# Patient Record
Sex: Male | Born: 1985 | Race: Black or African American | Hispanic: No | Marital: Married | Smoking: Former smoker
Health system: Southern US, Community
[De-identification: ages and names within clinical notes are randomized; demographics above are authoritative.]

## PROBLEM LIST (undated history)

## (undated) DIAGNOSIS — D709 Neutropenia, unspecified: Secondary | ICD-10-CM

## (undated) HISTORY — DX: Neutropenia, unspecified: D70.9

## (undated) HISTORY — PX: FRACTURE SURGERY: SHX138

## (undated) HISTORY — PX: HERNIA REPAIR: SHX51

## (undated) HISTORY — PX: OTHER SURGICAL HISTORY: SHX169

---

## 2016-12-03 ENCOUNTER — Ambulatory Visit (HOSPITAL_COMMUNITY)
Admission: EM | Admit: 2016-12-03 | Discharge: 2016-12-03 | Disposition: A | Discharge status: Home / Self Care | Payer: Self-pay | Attending: Family Medicine | Admitting: Family Medicine

## 2016-12-03 ENCOUNTER — Encounter (HOSPITAL_COMMUNITY): Payer: Self-pay | Admitting: *Deleted

## 2016-12-03 ENCOUNTER — Emergency Department (HOSPITAL_COMMUNITY)
Admission: EM | Admit: 2016-12-03 | Discharge: 2016-12-04 | Disposition: A | Discharge status: Home / Self Care | Payer: Medicaid Other | Attending: Physician Assistant | Admitting: Physician Assistant

## 2016-12-03 ENCOUNTER — Emergency Department (HOSPITAL_COMMUNITY): Payer: Medicaid Other

## 2016-12-03 ENCOUNTER — Ambulatory Visit (INDEPENDENT_AMBULATORY_CARE_PROVIDER_SITE_OTHER): Payer: Self-pay

## 2016-12-03 ENCOUNTER — Encounter (HOSPITAL_COMMUNITY): Payer: Self-pay | Admitting: Emergency Medicine

## 2016-12-03 DIAGNOSIS — X58XXXA Exposure to other specified factors, initial encounter: Secondary | ICD-10-CM | POA: Insufficient documentation

## 2016-12-03 DIAGNOSIS — Y939 Activity, unspecified: Secondary | ICD-10-CM | POA: Diagnosis not present

## 2016-12-03 DIAGNOSIS — Y929 Unspecified place or not applicable: Secondary | ICD-10-CM | POA: Diagnosis not present

## 2016-12-03 DIAGNOSIS — Y999 Unspecified external cause status: Secondary | ICD-10-CM | POA: Diagnosis not present

## 2016-12-03 DIAGNOSIS — M79602 Pain in left arm: Secondary | ICD-10-CM | POA: Diagnosis present

## 2016-12-03 DIAGNOSIS — S41102A Unspecified open wound of left upper arm, initial encounter: Secondary | ICD-10-CM | POA: Diagnosis not present

## 2016-12-03 DIAGNOSIS — R238 Other skin changes: Secondary | ICD-10-CM

## 2016-12-03 DIAGNOSIS — T300 Burn of unspecified body region, unspecified degree: Secondary | ICD-10-CM

## 2016-12-03 DIAGNOSIS — T148XXA Other injury of unspecified body region, initial encounter: Secondary | ICD-10-CM

## 2016-12-03 DIAGNOSIS — M869 Osteomyelitis, unspecified: Secondary | ICD-10-CM

## 2016-12-03 DIAGNOSIS — S42322A Displaced transverse fracture of shaft of humerus, left arm, initial encounter for closed fracture: Secondary | ICD-10-CM

## 2016-12-03 DIAGNOSIS — Z87891 Personal history of nicotine dependence: Secondary | ICD-10-CM | POA: Diagnosis not present

## 2016-12-03 LAB — BASIC METABOLIC PANEL
Anion gap: 8 (ref 5–15)
BUN: 9 mg/dL (ref 6–20)
CHLORIDE: 100 mmol/L — AB (ref 101–111)
CO2: 27 mmol/L (ref 22–32)
Calcium: 9.6 mg/dL (ref 8.9–10.3)
Creatinine, Ser: 0.92 mg/dL (ref 0.61–1.24)
GFR calc Af Amer: >60 mL/min (ref >60)
GFR calc non Af Amer: >60 mL/min (ref >60)
GLUCOSE: 92 mg/dL (ref 65–99)
POTASSIUM: 4.4 mmol/L (ref 3.5–5.1)
SODIUM: 135 mmol/L (ref 135–145)

## 2016-12-03 LAB — CBC WITH DIFFERENTIAL/PLATELET
BASOS PCT: 0 %
Basophils Absolute: 0 10*3/uL (ref 0.0–0.1)
EOS PCT: 4 %
Eosinophils Absolute: 0.3 10*3/uL (ref 0.0–0.7)
HEMATOCRIT: 41.5 % (ref 39.0–52.0)
Hemoglobin: 14.5 g/dL (ref 13.0–17.0)
LYMPHS ABS: 3.2 10*3/uL (ref 0.7–4.0)
Lymphocytes Relative: 41 %
MCH: 23.7 pg — AB (ref 26.0–34.0)
MCHC: 34.9 g/dL (ref 30.0–36.0)
MCV: 67.9 fL — AB (ref 78.0–100.0)
MONO ABS: 0.8 10*3/uL (ref 0.1–1.0)
MONOS PCT: 10 %
NEUTROS ABS: 3.5 10*3/uL (ref 1.7–7.7)
Neutrophils Relative %: 45 %
PLATELETS: 258 10*3/uL (ref 150–400)
RBC: 6.11 MIL/uL — ABNORMAL HIGH (ref 4.22–5.81)
RDW: 19.2 % — AB (ref 11.5–15.5)
WBC: 7.8 10*3/uL (ref 4.0–10.5)

## 2016-12-03 LAB — I-STAT CG4 LACTIC ACID, ED: Lactic Acid, Venous: 1.26 mmol/L (ref 0.5–1.9)

## 2016-12-03 MED ORDER — SODIUM CHLORIDE 0.9 % IV BOLUS (SEPSIS)
1000.0000 mL | Freq: Once | INTRAVENOUS | Status: AC
  Administered 2016-12-03: 1000 mL via INTRAVENOUS
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
## 2016-12-03 NOTE — Discharge Instructions (Signed)
Please go straight to the ER for further eval and likely IV antibiotics/admission
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
## 2016-12-04 MED ORDER — HYDROCODONE-ACETAMINOPHEN 5-325 MG PO TABS: 1.0000 | ORAL_TABLET | ORAL | 0 refills | Status: DC | PRN

## 2016-12-04 MED ORDER — DOXYCYCLINE HYCLATE 100 MG PO CAPS: 100.0000 mg | ORAL_CAPSULE | Freq: Two times a day (BID) | ORAL | 0 refills | Status: DC

## 2016-12-04 MED ORDER — IBUPROFEN 600 MG PO TABS: 600.0000 mg | ORAL_TABLET | Freq: Four times a day (QID) | ORAL | 0 refills | Status: DC | PRN

## 2016-12-04 MED ORDER — HYDROCODONE-ACETAMINOPHEN 5-325 MG PO TABS
2.0000 | ORAL_TABLET | Freq: Once | ORAL | Status: AC
  Administered 2016-12-04: 2 via ORAL
  Filled 2016-12-04: qty 2

## 2016-12-04 NOTE — Discharge Instructions (Addendum)
Please take all of your antibiotics until finished!   You may develop abdominal discomfort or diarrhea from the antibiotic.  You may help offset this with probiotics which you can buy or get in yogurt. Do not eat or take the probiotics until 2 hours after your antibiotic.   Antiinflammatory medications: Take 600 mg of ibuprofen every 6 hours or 440 mg (over the counter dose) to 500 mg (prescription dose) of naproxen every 12 hours for the next 3 days. After this time, these medications may be used as needed for pain. Take these medications with food to avoid upset stomach. Choose only one of these medications, do not take them together. Tylenol: Should you continue to have additional pain while taking the ibuprofen or naproxen, you may add in tylenol as needed. Your daily total maximum amount of tylenol from all sources should be limited to 4000mg /day for persons without liver problems, or 2000mg /day for those with liver problems. Vicodin: May use the Vicodin as needed for severe pain.  Do not drive or perform other dangerous activities while taking the Vicodin.  Please note that each pill of Vicodin contains 325 mg of Tylenol and therefore the above limitations apply.  Please follow up with the orthopedic surgeon on this matter. Call the number provided to set up an appointment.  An order has been placed for an MRI of the left arm. Please follow the attached instructions for fulfilling this imaging study.

## 2016-12-05 ENCOUNTER — Ambulatory Visit (INDEPENDENT_AMBULATORY_CARE_PROVIDER_SITE_OTHER): Payer: Self-pay | Admitting: Orthopaedic Surgery

## 2016-12-05 ENCOUNTER — Encounter (INDEPENDENT_AMBULATORY_CARE_PROVIDER_SITE_OTHER): Payer: Self-pay | Admitting: Orthopaedic Surgery

## 2016-12-05 DIAGNOSIS — S42322K Displaced transverse fracture of shaft of humerus, left arm, subsequent encounter for fracture with nonunion: Secondary | ICD-10-CM
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
## 2016-12-06 LAB — AEROBIC CULTURE W GRAM STAIN (SUPERFICIAL SPECIMEN)

## 2016-12-06 LAB — AEROBIC CULTURE  (SUPERFICIAL SPECIMEN)

## 2016-12-07 ENCOUNTER — Telehealth: Payer: Self-pay | Admitting: Emergency Medicine

## 2016-12-07 NOTE — Progress Notes (Signed)
ED Antimicrobial Stewardship Positive Culture Follow Up   Jason Maxwell is an 31 y.o. male who [facility] with a chief complaint of  Chief Complaint  Patient presents with  . Arm Pain  . Wound Infection    Recent Results (from the past 720 hour(s))  Culture, blood (routine x 2)     Status: None (Preliminary result)   Collection Time: 12/03/16 10:36 PM  Result Value Ref Range Status   Specimen Description BLOOD RIGHT ANTECUBITAL  Final   Special Requests   Final    BOTTLES DRAWN AEROBIC AND ANAEROBIC Blood Culture results may not be optimal due to an excessive volume of blood received in culture bottles   Culture NO GROWTH 2 DAYS  Final   Report Status PENDING  Incomplete  Culture, blood (routine x 2)     Status: None (Preliminary result)   Collection Time: 12/03/16 10:50 PM  Result Value Ref Range Status   Specimen Description BLOOD RIGHT HAND  Final   Special Requests   Final    BOTTLES DRAWN AEROBIC AND ANAEROBIC Blood Culture adequate volume   Culture NO GROWTH 2 DAYS  Final   Report Status PENDING  Incomplete  Wound or Superficial Culture     Status: None   Collection Time: 12/04/16  1:10 AM  Result Value Ref Range Status   Specimen Description WOUND LEFT ARM  Final   Special Requests WOUND, LEFT ARM  Final   Gram Stain   Final    MODERATE WBC PRESENT, PREDOMINANTLY PMN RARE GRAM POSITIVE COCCI    Culture   Final    MODERATE METHICILLIN RESISTANT STAPHYLOCOCCUS AUREUS   Report Status 12/06/2016 FINAL  Final   Organism ID, Bacteria METHICILLIN RESISTANT STAPHYLOCOCCUS AUREUS  Final      Susceptibility   Methicillin resistant staphylococcus aureus - MIC*    CIPROFLOXACIN >=8 RESISTANT Resistant     ERYTHROMYCIN <=0.25 SENSITIVE Sensitive     GENTAMICIN 8 INTERMEDIATE Intermediate     OXACILLIN >=4 RESISTANT Resistant     TETRACYCLINE >=16 RESISTANT Resistant     VANCOMYCIN <=0.5 SENSITIVE Sensitive     TRIMETH/SULFA >=320 RESISTANT  Resistant     CLINDAMYCIN <=0.25 SENSITIVE Sensitive     RIFAMPIN <=0.5 SENSITIVE Sensitive     Inducible Clindamycin NEGATIVE Sensitive     * MODERATE METHICILLIN RESISTANT STAPHYLOCOCCUS AUREUS    Resistant to doxy Please call Dr. Roda ShuttersXu with ortho office for treatment decision IV abx?  ED Provider: Claudia DesanctisEmily Shrosbee, PA-C  [name] 12/07/2016, 9:52 AM Infectious Diseases Pharmacist Phone# [phone]

## 2016-12-07 NOTE — Telephone Encounter (Signed)
Post ED Visit - Positive Culture Follow-up  Culture report reviewed by antimicrobial stewardship pharmacist:  []  Enzo BiNathan Batchelder, Pharm.D. []  Celedonio MiyamotoJeremy Frens, Pharm.D., BCPS AQ-ID [x]  Garvin FilaMike Maccia, Pharm.D., BCPS []  Georgina PillionElizabeth Martin, Pharm.D., BCPS []  YukonMinh Pham, [address] Rainbow BoulevardPharm.D., BCPS, AAHIVP []  Estella HuskMichelle Turner, Pharm.D., BCPS, AAHIVP []  Lysle Pearlachel Rumbarger, PharmD, BCPS []  Casilda Carlsaylor Stone, PharmD, BCPS []  Pollyann SamplesAndy Johnston, PharmD, BCPS  Positive wound culture Treated with doxycycline, organism sensitive to the same and no further patient follow-up is required at this time. Results of culture faxed to Dr. Cameron SprangMichael Xu  Johnney Scarlata 12/07/2016, 1:14 PM

## 2016-12-09 ENCOUNTER — Telehealth (INDEPENDENT_AMBULATORY_CARE_PROVIDER_SITE_OTHER): Payer: Self-pay | Admitting: Orthopaedic Surgery

## 2016-12-09 LAB — CULTURE, BLOOD (ROUTINE X 2)
CULTURE: NO GROWTH
Culture: NO GROWTH
Special Requests: ADEQUATE

## 2016-12-17 NOTE — Telephone Encounter (Signed)
Error

## 2017-01-01 ENCOUNTER — Ambulatory Visit (INDEPENDENT_AMBULATORY_CARE_PROVIDER_SITE_OTHER): Payer: Self-pay | Admitting: Physical Medicine and Rehabilitation

## 2017-01-01 ENCOUNTER — Encounter (INDEPENDENT_AMBULATORY_CARE_PROVIDER_SITE_OTHER): Payer: Self-pay | Admitting: Physical Medicine and Rehabilitation

## 2017-01-01 DIAGNOSIS — R202 Paresthesia of skin: Secondary | ICD-10-CM

## 2017-01-01 DIAGNOSIS — R531 Weakness: Secondary | ICD-10-CM

## 2017-01-01 DIAGNOSIS — S42322K Displaced transverse fracture of shaft of humerus, left arm, subsequent encounter for fracture with nonunion: Secondary | ICD-10-CM
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
## 2017-01-06 ENCOUNTER — Encounter (INDEPENDENT_AMBULATORY_CARE_PROVIDER_SITE_OTHER): Payer: Self-pay | Admitting: Physical Medicine and Rehabilitation
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
## 2017-03-11 ENCOUNTER — Encounter (HOSPITAL_COMMUNITY): Payer: Self-pay | Admitting: *Deleted

## 2017-03-11 ENCOUNTER — Other Ambulatory Visit: Payer: Self-pay

## 2017-03-11 NOTE — Anesthesia Preprocedure Evaluation (Signed)
Anesthesia Evaluation  Patient identified by MRN, date of birth, ID band Patient awake    Reviewed: Allergy & Precautions, H&P , Patient's Chart, lab work & pertinent test results, reviewed documented beta blocker date and time   Airway Mallampati: II  TM Distance: >3 FB Neck ROM: full    Dental no notable dental hx.    Pulmonary former smoker,    Pulmonary exam normal breath sounds clear to auscultation       Cardiovascular  Rhythm:regular Rate:Normal     Neuro/Psych    GI/Hepatic   Endo/Other    Renal/GU      Musculoskeletal   Abdominal   Peds  Hematology   Anesthesia Other Findings   Reproductive/Obstetrics                             Anesthesia Physical Anesthesia Plan  ASA: II  Anesthesia Plan: General   Post-op Pain Management:  Regional for Post-op pain   Induction: Intravenous  PONV Risk Score and Plan:   Airway Management Planned: Oral ETT  Additional Equipment:   Intra-op Plan:   Post-operative Plan: Extubation in OR  Informed Consent: I have reviewed the patients History and Physical, chart, labs and discussed the procedure including the risks, benefits and alternatives for the proposed anesthesia with the patient or authorized representative who has indicated his/her understanding and acceptance.   Dental Advisory Given  Plan Discussed with: CRNA and Surgeon  Anesthesia Plan Comments: (  )        Anesthesia Quick Evaluation
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
## 2017-03-11 NOTE — Progress Notes (Signed)
Spoke with pt for pre-op call via [place] French interpreter, Cory[place] # [phone]. Pt denies any cardiac history, chest pain, sob or diabetes.

## 2017-03-12 ENCOUNTER — Inpatient Hospital Stay (HOSPITAL_COMMUNITY): Payer: Self-pay

## 2017-03-12 ENCOUNTER — Inpatient Hospital Stay (HOSPITAL_COMMUNITY)
Admission: RE | Admit: 2017-03-12 | Discharge: 2017-03-14 | DRG: 493 | Disposition: A | Discharge status: Home Health | Payer: Self-pay | Source: Ambulatory Visit | Attending: Student | Admitting: Student

## 2017-03-12 ENCOUNTER — Inpatient Hospital Stay (HOSPITAL_COMMUNITY): Payer: Self-pay | Admitting: Anesthesiology

## 2017-03-12 ENCOUNTER — Encounter (HOSPITAL_COMMUNITY): Payer: Self-pay | Admitting: *Deleted

## 2017-03-12 ENCOUNTER — Encounter (HOSPITAL_COMMUNITY)
Admission: RE | Disposition: A | Discharge status: Home Health | Payer: Self-pay | Source: Ambulatory Visit | Attending: Student

## 2017-03-12 DIAGNOSIS — Z87891 Personal history of nicotine dependence: Secondary | ICD-10-CM

## 2017-03-12 DIAGNOSIS — Z8781 Personal history of (healed) traumatic fracture: Secondary | ICD-10-CM

## 2017-03-12 DIAGNOSIS — Z8619 Personal history of other infectious and parasitic diseases: Secondary | ICD-10-CM

## 2017-03-12 DIAGNOSIS — Z1629 Resistance to other single specified antibiotic: Secondary | ICD-10-CM | POA: Diagnosis present

## 2017-03-12 DIAGNOSIS — B9562 Methicillin resistant Staphylococcus aureus infection as the cause of diseases classified elsewhere: Secondary | ICD-10-CM | POA: Diagnosis present

## 2017-03-12 DIAGNOSIS — M869 Osteomyelitis, unspecified: Secondary | ICD-10-CM | POA: Diagnosis present

## 2017-03-12 DIAGNOSIS — S42302K Unspecified fracture of shaft of humerus, left arm, subsequent encounter for fracture with nonunion: Secondary | ICD-10-CM

## 2017-03-12 DIAGNOSIS — T148XXA Other injury of unspecified body region, initial encounter: Secondary | ICD-10-CM

## 2017-03-12 DIAGNOSIS — S42309A Unspecified fracture of shaft of humerus, unspecified arm, initial encounter for closed fracture: Secondary | ICD-10-CM

## 2017-03-12 DIAGNOSIS — M86422 Chronic osteomyelitis with draining sinus, left humerus: Principal | ICD-10-CM | POA: Diagnosis present

## 2017-03-12 DIAGNOSIS — Z91018 Allergy to other foods: Secondary | ICD-10-CM

## 2017-03-12 DIAGNOSIS — A4902 Methicillin resistant Staphylococcus aureus infection, unspecified site: Secondary | ICD-10-CM

## 2017-03-12 HISTORY — PX: HUMERUS IM NAIL: SHX1769

## 2017-03-12 HISTORY — PX: INCISION AND DRAINAGE OF WOUND: SHX1803

## 2017-03-12 HISTORY — PX: IM NAILING HUMERUS: SUR733

## 2017-03-12 LAB — C-REACTIVE PROTEIN: CRP: 2.2 mg/dL — AB (ref <1.0)

## 2017-03-12 LAB — CBC
HCT: 39.2 % (ref 39.0–52.0)
Hemoglobin: 13.8 g/dL (ref 13.0–17.0)
MCH: 24.4 pg — AB (ref 26.0–34.0)
MCHC: 35.2 g/dL (ref 30.0–36.0)
MCV: 69.3 fL — ABNORMAL LOW (ref 78.0–100.0)
PLATELETS: 221 10*3/uL (ref 150–400)
RBC: 5.66 MIL/uL (ref 4.22–5.81)
RDW: 15.6 % — ABNORMAL HIGH (ref 11.5–15.5)
WBC: 11.5 10*3/uL — AB (ref 4.0–10.5)

## 2017-03-12 LAB — CBC WITH DIFFERENTIAL/PLATELET
BASOS ABS: 0.1 10*3/uL (ref 0.0–0.1)
BASOS PCT: 1 %
EOS ABS: 0.8 10*3/uL — AB (ref 0.0–0.7)
Eosinophils Relative: 11 %
HEMATOCRIT: 41.8 % (ref 39.0–52.0)
HEMOGLOBIN: 14.4 g/dL (ref 13.0–17.0)
Lymphocytes Relative: 28 %
Lymphs Abs: 2.2 10*3/uL (ref 0.7–4.0)
MCH: 24.2 pg — ABNORMAL LOW (ref 26.0–34.0)
MCHC: 34.4 g/dL (ref 30.0–36.0)
MCV: 70.1 fL — ABNORMAL LOW (ref 78.0–100.0)
Monocytes Absolute: 1.1 10*3/uL — ABNORMAL HIGH (ref 0.1–1.0)
Monocytes Relative: 14 %
NEUTROS ABS: 3.5 10*3/uL (ref 1.7–7.7)
NEUTROS PCT: 46 %
Platelets: 238 10*3/uL (ref 150–400)
RBC: 5.96 MIL/uL — ABNORMAL HIGH (ref 4.22–5.81)
RDW: 15.6 % — ABNORMAL HIGH (ref 11.5–15.5)
WBC: 7.6 10*3/uL (ref 4.0–10.5)

## 2017-03-12 LAB — SEDIMENTATION RATE: SED RATE: 1 mm/h (ref 0–16)

## 2017-03-12 LAB — CREATININE, SERUM
CREATININE: 1.01 mg/dL (ref 0.61–1.24)
GFR calc Af Amer: >60 mL/min (ref >60)
GFR calc non Af Amer: >60 mL/min (ref >60)

## 2017-03-12 SURGERY — INSERTION, INTRAMEDULLARY ROD, HUMERUS
Anesthesia: General | Laterality: Left

## 2017-03-12 MED ORDER — ROCURONIUM BROMIDE 10 MG/ML (PF) SYRINGE
PREFILLED_SYRINGE | INTRAVENOUS | Status: AC
  Filled 2017-03-12: qty 5

## 2017-03-12 MED ORDER — SUCCINYLCHOLINE CHLORIDE 200 MG/10ML IV SOSY
PREFILLED_SYRINGE | INTRAVENOUS | Status: AC
  Filled 2017-03-12: qty 10

## 2017-03-12 MED ORDER — ENOXAPARIN SODIUM 30 MG/0.3ML ~~LOC~~ SOLN
30.0000 mg | Freq: Two times a day (BID) | SUBCUTANEOUS | Status: DC
  Administered 2017-03-13 – 2017-03-14 (×2): 30 mg via SUBCUTANEOUS
  Filled 2017-03-12 (×2): qty 0.3

## 2017-03-12 MED ORDER — FENTANYL CITRATE (PF) 100 MCG/2ML IJ SOLN
INTRAMUSCULAR | Status: DC | PRN
  Administered 2017-03-12: 150 ug via INTRAVENOUS
  Administered 2017-03-12: 100 ug via INTRAVENOUS

## 2017-03-12 MED ORDER — MIDAZOLAM HCL 2 MG/2ML IJ SOLN
INTRAMUSCULAR | Status: AC
  Filled 2017-03-12: qty 2

## 2017-03-12 MED ORDER — MORPHINE SULFATE (PF) 2 MG/ML IV SOLN: 2.0000 mg | INTRAVENOUS | Status: DC | PRN

## 2017-03-12 MED ORDER — 0.9 % SODIUM CHLORIDE (POUR BTL) OPTIME
TOPICAL | Status: DC | PRN
  Administered 2017-03-12: 1000 mL

## 2017-03-12 MED ORDER — CEFAZOLIN SODIUM-DEXTROSE 2-4 GM/100ML-% IV SOLN
INTRAVENOUS | Status: AC
  Filled 2017-03-12: qty 100

## 2017-03-12 MED ORDER — KETOROLAC TROMETHAMINE 15 MG/ML IJ SOLN
15.0000 mg | Freq: Four times a day (QID) | INTRAMUSCULAR | Status: DC
  Administered 2017-03-12 – 2017-03-14 (×9): 15 mg via INTRAVENOUS
  Filled 2017-03-12 (×9): qty 1

## 2017-03-12 MED ORDER — ACETAMINOPHEN 650 MG RE SUPP: 650.0000 mg | Freq: Four times a day (QID) | RECTAL | Status: DC | PRN

## 2017-03-12 MED ORDER — LACTATED RINGERS IV SOLN
INTRAVENOUS | Status: DC | PRN
  Administered 2017-03-12 (×2): via INTRAVENOUS

## 2017-03-12 MED ORDER — HYDROMORPHONE HCL 1 MG/ML IJ SOLN: 0.2500 mg | INTRAMUSCULAR | Status: DC | PRN

## 2017-03-12 MED ORDER — SENNA 8.6 MG PO TABS
1.0000 | ORAL_TABLET | Freq: Two times a day (BID) | ORAL | Status: DC
  Administered 2017-03-12 – 2017-03-14 (×5): 8.6 mg via ORAL
  Filled 2017-03-12 (×6): qty 1

## 2017-03-12 MED ORDER — TOBRAMYCIN SULFATE 1.2 G IJ SOLR
INTRAMUSCULAR | Status: DC | PRN
  Administered 2017-03-12: 2.4 mg

## 2017-03-12 MED ORDER — ROCURONIUM BROMIDE 100 MG/10ML IV SOLN
INTRAVENOUS | Status: DC | PRN
  Administered 2017-03-12: 20 mg via INTRAVENOUS
  Administered 2017-03-12: 40 mg via INTRAVENOUS

## 2017-03-12 MED ORDER — ONDANSETRON HCL 4 MG/2ML IJ SOLN
INTRAMUSCULAR | Status: AC
  Filled 2017-03-12: qty 2

## 2017-03-12 MED ORDER — DIPHENHYDRAMINE HCL 12.5 MG/5ML PO ELIX: 12.5000 mg | ORAL_SOLUTION | ORAL | Status: DC | PRN

## 2017-03-12 MED ORDER — VANCOMYCIN HCL 1000 MG IV SOLR
INTRAVENOUS | Status: AC
  Filled 2017-03-12: qty 1000

## 2017-03-12 MED ORDER — VANCOMYCIN HCL IN DEXTROSE 1-5 GM/200ML-% IV SOLN
1000.0000 mg | Freq: Once | INTRAVENOUS | Status: AC
  Administered 2017-03-12: 1000 mg via INTRAVENOUS
  Filled 2017-03-12: qty 200

## 2017-03-12 MED ORDER — FENTANYL CITRATE (PF) 250 MCG/5ML IJ SOLN
INTRAMUSCULAR | Status: AC
  Filled 2017-03-12: qty 5

## 2017-03-12 MED ORDER — METHOCARBAMOL 1000 MG/10ML IJ SOLN
500.0000 mg | Freq: Four times a day (QID) | INTRAVENOUS | Status: DC | PRN
  Filled 2017-03-12: qty 5

## 2017-03-12 MED ORDER — VANCOMYCIN HCL 1000 MG IV SOLR
INTRAVENOUS | Status: DC | PRN
  Administered 2017-03-12: 3000 mg

## 2017-03-12 MED ORDER — SUGAMMADEX SODIUM 200 MG/2ML IV SOLN
INTRAVENOUS | Status: DC | PRN
  Administered 2017-03-12: 150 mg via INTRAVENOUS

## 2017-03-12 MED ORDER — ONDANSETRON HCL 4 MG/2ML IJ SOLN
INTRAMUSCULAR | Status: DC | PRN
  Administered 2017-03-12: 4 mg via INTRAVENOUS

## 2017-03-12 MED ORDER — DOCUSATE SODIUM 100 MG PO CAPS
100.0000 mg | ORAL_CAPSULE | Freq: Two times a day (BID) | ORAL | Status: DC
  Administered 2017-03-12 – 2017-03-14 (×5): 100 mg via ORAL
  Filled 2017-03-12 (×6): qty 1

## 2017-03-12 MED ORDER — PHENYLEPHRINE HCL 10 MG/ML IJ SOLN
INTRAMUSCULAR | Status: DC | PRN
  Administered 2017-03-12 (×3): 40 ug via INTRAVENOUS

## 2017-03-12 MED ORDER — DEXTROSE 5 % IV SOLN
2.0000 g | INTRAVENOUS | Status: DC
  Administered 2017-03-12 – 2017-03-14 (×3): 2 g via INTRAVENOUS
  Filled 2017-03-12 (×3): qty 2

## 2017-03-12 MED ORDER — PHENYLEPHRINE 40 MCG/ML (10ML) SYRINGE FOR IV PUSH (FOR BLOOD PRESSURE SUPPORT)
PREFILLED_SYRINGE | INTRAVENOUS | Status: AC
  Filled 2017-03-12: qty 10

## 2017-03-12 MED ORDER — EPHEDRINE 5 MG/ML INJ
INTRAVENOUS | Status: AC
  Filled 2017-03-12: qty 10

## 2017-03-12 MED ORDER — BACITRACIN ZINC 500 UNIT/GM EX OINT
TOPICAL_OINTMENT | CUTANEOUS | Status: AC
  Filled 2017-03-12: qty 28.35

## 2017-03-12 MED ORDER — ACETAMINOPHEN 500 MG PO TABS
1000.0000 mg | ORAL_TABLET | Freq: Four times a day (QID) | ORAL | Status: DC
  Administered 2017-03-12 – 2017-03-14 (×9): 1000 mg via ORAL
  Filled 2017-03-12 (×9): qty 2

## 2017-03-12 MED ORDER — BUPIVACAINE-EPINEPHRINE (PF) 0.5% -1:200000 IJ SOLN
INTRAMUSCULAR | Status: DC | PRN
  Administered 2017-03-12: 25 mL via PERINEURAL

## 2017-03-12 MED ORDER — METHOCARBAMOL 500 MG PO TABS
500.0000 mg | ORAL_TABLET | Freq: Four times a day (QID) | ORAL | Status: DC | PRN
  Administered 2017-03-13 – 2017-03-14 (×2): 500 mg via ORAL
  Filled 2017-03-12 (×2): qty 1

## 2017-03-12 MED ORDER — PROPOFOL 10 MG/ML IV BOLUS
INTRAVENOUS | Status: DC | PRN
  Administered 2017-03-12: 150 mg via INTRAVENOUS

## 2017-03-12 MED ORDER — OXYCODONE HCL 5 MG PO TABS: 5.0000 mg | ORAL_TABLET | ORAL | Status: DC | PRN

## 2017-03-12 MED ORDER — PROPOFOL 10 MG/ML IV BOLUS
INTRAVENOUS | Status: AC
Start: 2017-03-12 — End: 2017-03-12
  Filled 2017-03-12: qty 20

## 2017-03-12 MED ORDER — ACETAMINOPHEN 325 MG PO TABS: 650.0000 mg | ORAL_TABLET | Freq: Four times a day (QID) | ORAL | Status: DC | PRN

## 2017-03-12 MED ORDER — VANCOMYCIN HCL 1000 MG IV SOLR
INTRAVENOUS | Status: AC
  Filled 2017-03-12: qty 3000

## 2017-03-12 MED ORDER — LIDOCAINE HCL (CARDIAC) 20 MG/ML IV SOLN
INTRAVENOUS | Status: DC | PRN
  Administered 2017-03-12: 100 mg via INTRAVENOUS

## 2017-03-12 MED ORDER — CEFAZOLIN SODIUM-DEXTROSE 2-4 GM/100ML-% IV SOLN
2.0000 g | INTRAVENOUS | Status: AC
  Administered 2017-03-12: 2 g via INTRAVENOUS

## 2017-03-12 MED ORDER — MIDAZOLAM HCL 5 MG/5ML IJ SOLN
INTRAMUSCULAR | Status: DC | PRN
  Administered 2017-03-12: 2 mg via INTRAVENOUS

## 2017-03-12 MED ORDER — VANCOMYCIN HCL IN DEXTROSE 750-5 MG/150ML-% IV SOLN
750.0000 mg | Freq: Three times a day (TID) | INTRAVENOUS | Status: DC
  Administered 2017-03-13 – 2017-03-14 (×5): 750 mg via INTRAVENOUS
  Filled 2017-03-12 (×5): qty 150

## 2017-03-12 MED ORDER — SUGAMMADEX SODIUM 200 MG/2ML IV SOLN
INTRAVENOUS | Status: AC
  Filled 2017-03-12: qty 2

## 2017-03-12 MED ORDER — CHLORHEXIDINE GLUCONATE 4 % EX LIQD: 60.0000 mL | Freq: Once | CUTANEOUS | Status: DC

## 2017-03-12 MED ORDER — LIDOCAINE 2% (20 MG/ML) 5 ML SYRINGE
INTRAMUSCULAR | Status: AC
  Filled 2017-03-12: qty 10

## 2017-03-12 MED ORDER — DEXAMETHASONE SODIUM PHOSPHATE 10 MG/ML IJ SOLN
INTRAMUSCULAR | Status: DC | PRN
  Administered 2017-03-12: 5 mg via INTRAVENOUS

## 2017-03-12 MED ORDER — TOBRAMYCIN SULFATE 1.2 G IJ SOLR
INTRAMUSCULAR | Status: AC
  Filled 2017-03-12: qty 4.8

## 2017-03-12 MED ORDER — DEXAMETHASONE SODIUM PHOSPHATE 10 MG/ML IJ SOLN
INTRAMUSCULAR | Status: AC
  Filled 2017-03-12: qty 1

## 2017-03-12 SURGICAL SUPPLY — 59 items
BANDAGE ACE 4X5 VEL STRL LF (GAUZE/BANDAGES/DRESSINGS) ×3 IMPLANT
BIT DRILL 2.9 SHORT NS (BIT) ×1
BIT DRILL 2.9MM SHORT NS (BIT) ×1 IMPLANT
BIT DRILL 3.8 (BIT) ×2
BIT DRILL 3.8XNS DISP GRN (BIT) ×1 IMPLANT
BIT DRL 3.8XNS DISP GRN (BIT) ×1
BRUSH SCRUB SURG 4.25 DISP (MISCELLANEOUS) ×6 IMPLANT
CEMENT BONE REFOBACIN R1X40 US (Cement) ×6 IMPLANT
CHLORAPREP W/TINT 26ML (MISCELLANEOUS) ×3 IMPLANT
COVER SURGICAL LIGHT HANDLE (MISCELLANEOUS) ×6 IMPLANT
DRAPE C-ARM 42X72 X-RAY (DRAPES) ×3 IMPLANT
DRAPE C-ARMOR (DRAPES) ×3 IMPLANT
DRAPE IMP U-DRAPE 54X76 (DRAPES) ×3 IMPLANT
DRAPE INCISE IOBAN 66X45 STRL (DRAPES) ×3 IMPLANT
DRAPE U-SHAPE 47X51 STRL (DRAPES) ×3 IMPLANT
DRILL BIT 2.9MM SHORT NS (BIT) ×2
DRSG ADAPTIC 3X8 NADH LF (GAUZE/BANDAGES/DRESSINGS) ×3 IMPLANT
DRSG PAD ABDOMINAL 8X10 ST (GAUZE/BANDAGES/DRESSINGS) ×9 IMPLANT
ELECT REM PT RETURN 9FT ADLT (ELECTROSURGICAL)
ELECTRODE REM PT RTRN 9FT ADLT (ELECTROSURGICAL) IMPLANT
GAUZE SPONGE 4X4 12PLY STRL (GAUZE/BANDAGES/DRESSINGS) ×3 IMPLANT
GLOVE BIO SURGEON STRL SZ7.5 (GLOVE) ×12 IMPLANT
GLOVE BIOGEL PI IND STRL 7.5 (GLOVE) ×1 IMPLANT
GLOVE BIOGEL PI INDICATOR 7.5 (GLOVE) ×2
GOWN STRL REUS W/ TWL LRG LVL3 (GOWN DISPOSABLE) ×2 IMPLANT
GOWN STRL REUS W/ TWL XL LVL3 (GOWN DISPOSABLE) ×1 IMPLANT
GOWN STRL REUS W/TWL LRG LVL3 (GOWN DISPOSABLE) ×4
GOWN STRL REUS W/TWL XL LVL3 (GOWN DISPOSABLE) ×2
GUIDEWIRE HUMERAL 2.2MMX711MM (WIRE) ×3 IMPLANT
KIT BASIN OR (CUSTOM PROCEDURE TRAY) ×3 IMPLANT
KIT ROOM TURNOVER OR (KITS) ×3 IMPLANT
MANIFOLD NEPTUNE II (INSTRUMENTS) ×3 IMPLANT
NAIL HUMERAL 7X220 (Nail) ×3 IMPLANT
NS IRRIG 1000ML POUR BTL (IV SOLUTION) ×3 IMPLANT
PACK TOTAL JOINT (CUSTOM PROCEDURE TRAY) ×3 IMPLANT
PAD ABD 8X10 STRL (GAUZE/BANDAGES/DRESSINGS) ×3 IMPLANT
PAD ARMBOARD 7.5X6 YLW CONV (MISCELLANEOUS) ×6 IMPLANT
PADDING CAST ABS 4INX4YD NS (CAST SUPPLIES) ×2
PADDING CAST ABS COTTON 4X4 ST (CAST SUPPLIES) ×1 IMPLANT
PIN GUIDE 3.2X14 1401214 (MISCELLANEOUS) ×3 IMPLANT
SCREW 3.5MM CORT NONSTRL 28MM (Screw) ×3 IMPLANT
SCREW ACECAP 30MM (Screw) ×3 IMPLANT
SCREW CORTICAL 3.5X24 STRL (Screw) ×3 IMPLANT
SCREWDRIVER HEX TIP 3.5MM (MISCELLANEOUS) ×3 IMPLANT
STAPLER VISISTAT 35W (STAPLE) ×3 IMPLANT
SUCTION FRAZIER HANDLE 10FR (MISCELLANEOUS) ×2
SUCTION TUBE FRAZIER 10FR DISP (MISCELLANEOUS) ×1 IMPLANT
SUT FIBERWIRE 2-0 18 17.9 3/8 (SUTURE)
SUT MNCRL AB 3-0 PS2 18 (SUTURE) ×3 IMPLANT
SUT MON AB 2-0 CT1 36 (SUTURE) ×3 IMPLANT
SUT VIC AB 0 CT1 27 (SUTURE) ×4
SUT VIC AB 0 CT1 27XBRD ANBCTR (SUTURE) ×2 IMPLANT
SUT VIC AB 2-0 CT1 27 (SUTURE) ×4
SUT VIC AB 2-0 CT1 27XBRD (SUTURE) ×2 IMPLANT
SUT VIC AB 2-0 CT3 27 (SUTURE) IMPLANT
SUTURE FIBERWR 2-0 18 17.9 3/8 (SUTURE) IMPLANT
TRAY FOLEY W/METER SILVER 16FR (SET/KITS/TRAYS/PACK) IMPLANT
WATER STERILE IRR 1000ML POUR (IV SOLUTION) ×3 IMPLANT
YANKAUER SUCT BULB TIP NO VENT (SUCTIONS) ×3 IMPLANT

## 2017-03-12 NOTE — Progress Notes (Signed)
Pharmacy Antibiotic Note  Jason Maxwell is a 32 y.o. 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  Pharmacy has been consulted for vancomycin dosing.  Per ID note, planning 6-8 weeks of IV antibiotics then oral antibiotics.  Plan: 1. Vancomycin 750 mg IV q 8 hrs. 2. Ceftriaxone 2g IV q 24 hrs. 3. Check vancomycin trough at steady state as indicated.  Height: 5' 0.75" (154.3 cm) Weight: 163 lb (73.9 kg) IBW/kg (Calculated) : 51.73  Temp (24hrs), Avg:98.4 F (36.9 C), Min:97.9 F (36.6 C), Max:98.8 F (37.1 C)  Recent Labs  Lab 03/12/17 0736 03/12/17 1500  WBC 7.6 11.5*  CREATININE  --  1.01    Estimated Creatinine Clearance: 90.8 mL/min (by C-G formula based on SCr of 1.01 mg/dL).    Allergies  Allergen Reactions  . Pork-Derived Products     Religious reasons     Antimicrobials this admission: Vancomycin 2/6 > Ceftriaxone 2/6 >  Dose adjustments this admission:   Microbiology results:  BCx:   UCx:    Sputum:    MRSA PCR:   Thank you for allowing pharmacy to be a part of this patient's care.  Tad MooreJessica Mella Inclan, Pharm D, BCPS  Clinical Pharmacist Pager [phone]  03/12/2017 4:29 PM

## 2017-03-12 NOTE — Transfer of Care (Signed)
Immediate Anesthesia Transfer of Care Note  Patient: Jason Maxwell  Procedure(s) Performed: INTRAMEDULLARY (IM) NAIL LEFT HUMERAL (Left ) IRRIGATION AND DEBRIDEMENT LEFT HUMEROUS WOUND (Left )  Patient Location: PACU  Anesthesia Type:General  Level of Consciousness: awake, alert , oriented and patient cooperative  Airway & Oxygen Therapy: Patient Spontanous Breathing and Patient connected to nasal cannula oxygen  Post-op Assessment: Report given to RN, Post -op Vital signs reviewed and stable and Patient moving all extremities  Post vital signs: Reviewed and stable  Last Vitals:  Vitals:   03/12/17 0714 03/12/17 1148  BP: (!) 146/76 132/81  Pulse: 85 88  Resp: 20 (!) 28  Temp: 37 C 36.9 C  SpO2: 99% 100%    Last Pain:  Vitals:   03/12/17 1148  TempSrc:   PainSc: 0-No pain      Patients Stated Pain Goal: 5 (03/12/17 0743)  Complications: No apparent anesthesia complications

## 2017-03-12 NOTE — Anesthesia Procedure Notes (Signed)
Procedure Name: Intubation Date/Time: 03/12/2017 8:42 AM Performed by: Coralee RudFlores, Andi Layfield, CRNA Pre-anesthesia Checklist: Patient identified, Emergency Drugs available, Suction available and Patient being monitored Patient Re-evaluated:Patient Re-evaluated prior to induction Oxygen Delivery Method: Circle system utilized Preoxygenation: Pre-oxygenation with 100% oxygen Induction Type: IV induction Ventilation: Mask ventilation without difficulty Laryngoscope Size: Miller and 3 Grade View: Grade II Tube type: Oral Tube size: 7.5 mm Number of attempts: 1 Placement Confirmation: ETT inserted through vocal cords under direct vision,  positive ETCO2 and breath sounds checked- equal and bilateral Secured at: 22 cm Tube secured with: Tape Dental Injury: Teeth and Oropharynx as per pre-operative assessment
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
## 2017-03-12 NOTE — Anesthesia Procedure Notes (Signed)
Anesthesia Regional Block: Interscalene brachial plexus block   Pre-Anesthetic Checklist: ,, timeout performed, Correct Patient, Correct Site, Correct Laterality, Correct Procedure, Correct Position, site marked, Risks and benefits discussed, pre-op evaluation,  At surgeon's request and post-op pain management  Laterality: Left  Prep: chloraprep       Needles:   Needle Type: Echogenic Needle     Needle Length: 9cm  Needle Gauge: 21     Additional Needles:   Narrative:  Start time: 03/12/2017 8:04 AM End time: 03/12/2017 8:10 AM Injection made incrementally with aspirations every 5 mL. Anesthesiologist: Cristela BlueJackson, Dsean Vantol, MD
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
## 2017-03-13 DIAGNOSIS — A4902 Methicillin resistant Staphylococcus aureus infection, unspecified site: Secondary | ICD-10-CM

## 2017-03-13 LAB — CBC
HEMATOCRIT: 36.4 % — AB (ref 39.0–52.0)
Hemoglobin: 12.4 g/dL — ABNORMAL LOW (ref 13.0–17.0)
MCH: 23.7 pg — ABNORMAL LOW (ref 26.0–34.0)
MCHC: 34.1 g/dL (ref 30.0–36.0)
MCV: 69.5 fL — AB (ref 78.0–100.0)
Platelets: 212 10*3/uL (ref 150–400)
RBC: 5.24 MIL/uL (ref 4.22–5.81)
RDW: 15.7 % — AB (ref 11.5–15.5)
WBC: 10.9 10*3/uL — ABNORMAL HIGH (ref 4.0–10.5)

## 2017-03-13 LAB — HIV ANTIBODY (ROUTINE TESTING W REFLEX): HIV SCREEN 4TH GENERATION: NONREACTIVE

## 2017-03-13 LAB — BASIC METABOLIC PANEL
ANION GAP: 10 (ref 5–15)
BUN: 13 mg/dL (ref 6–20)
CHLORIDE: 105 mmol/L (ref 101–111)
CO2: 24 mmol/L (ref 22–32)
Calcium: 8.7 mg/dL — ABNORMAL LOW (ref 8.9–10.3)
Creatinine, Ser: 1.03 mg/dL (ref 0.61–1.24)
GFR calc Af Amer: >60 mL/min (ref >60)
Glucose, Bld: 113 mg/dL — ABNORMAL HIGH (ref 65–99)
POTASSIUM: 3.8 mmol/L (ref 3.5–5.1)
SODIUM: 139 mmol/L (ref 135–145)

## 2017-03-13 LAB — SEDIMENTATION RATE: Sed Rate: 11 mm/hr (ref 0–16)

## 2017-03-13 LAB — C-REACTIVE PROTEIN: CRP: 3.4 mg/dL — ABNORMAL HIGH (ref <1.0)

## 2017-03-13 MED ORDER — RIFAMPIN 300 MG PO CAPS
300.0000 mg | ORAL_CAPSULE | Freq: Two times a day (BID) | ORAL | Status: DC
  Administered 2017-03-13 – 2017-03-14 (×4): 300 mg via ORAL
  Filled 2017-03-13 (×4): qty 1

## 2017-03-13 NOTE — Progress Notes (Signed)
Orthopaedic Trauma Progress Note  S: Patient in good spirits this morning.  Pain well controlled.  O:  Vitals:   03/13/17 0032 03/13/17 0507  BP: (!) 99/53 106/60  Pulse: 76 73  Resp: 18 18  Temp: 97.9 F (36.6 C) 97.9 F (36.6 C)  SpO2: 100% 100%   Left upper extremity dressing clean dry and intact.  Motor function at baseline.  Labs:  CBC    Component Value Date/Time   WBC 10.9 (H) 03/13/2017 0651   RBC 5.24 03/13/2017 0651   HGB 12.4 (L) 03/13/2017 0651   HCT 36.4 (L) 03/13/2017 0651   PLT 212 03/13/2017 0651   MCV 69.5 (L) 03/13/2017 0651   MCH 23.7 (L) 03/13/2017 0651   MCHC 34.1 03/13/2017 0651   RDW 15.7 (H) 03/13/2017 0651   LYMPHSABS 2.2 03/12/2017 0736   MONOABS 1.1 (H) 03/12/2017 0736   EOSABS 0.8 (H) 03/12/2017 0736   BASOSABS 0.1 03/12/2017 [id number]    A/P: 32 year old male with left infected nonunion status post radical debridement and retrograde intramedullary nailing and antibiotic spacer placement  -All specimens were sent to pathology so we have no cultures currently -We will likely need broad-spectrum antibiotics.  I appreciate infectious disease input -We will obtain PICC line today -We will change dressing tomorrow and set up for home health IV antibiotics.  Roby LoftsKevin P. Aubriauna Riner, MD [facility] [phone] (phone)

## 2017-03-13 NOTE — Progress Notes (Signed)
Orthopaedic Trauma Service Follow Up  OTS followed up on labs yesterday evening after surgery 2 specimens were for micro and 2 for pathology No gram stain was completed, Dr. Jena GaussHaddix called the lab yesterday and was informed that pathology was closed and there was no way to gain access to the specimens  He was then transferred to microbiology who confirmed that no specimens were sent down  I call pathology and then histology this am.  They confirm they have 4 specimens. Unfortunately all specimens have been placed in formalin for evaluation   Wound cultures of L arm from 11/2016 grew out MRSA   Will follow up with OR nursing staff   Mearl LatinKeith W. Acy Orsak, PA-C [facility] [phone]([phone] (C) [phone] (O) 03/13/2017 9:03 AM

## 2017-03-13 NOTE — Care Management Note (Signed)
Case Management Note  Patient Details  Name: Randell Loopbourahima Winquist MRN: [id number] Date of Birth: 08/19/1985  Subjective/Objective:  32 yr old male s/p IM nailing of left humerus with insertion of antibiotic cement spacer.                  Action/Plan: Case manager contacted Jeri ModenaPam Chandler RN, IV antibiotic specialist and Shon Milletan Phillips RN, [facility] Liaison with referral for Home IV antibiotics. Patient will get PICC Line. CM did inform Jesusita OkaDan that patient speaks [place] so they are aware. He has family that speaks english. CM will continue to monitor.      Expected Discharge Date:     pending             Expected Discharge Plan:  Home w Home Health Services  In-House Referral:  NA  Discharge planning Services  CM Consult  Post Acute Care Choice:  Home Health Choice offered to:  NA  DME Arranged:  IV pump/equipment DME Agency:  [facility]  HH Arranged:  RN, IV Antibiotics HH Agency:  [facility]  Status of Service:  In process, will continue to follow  If discussed at Long Length of Stay Meetings, dates discussed:    Additional Comments:  Durenda GuthrieBrady, Tayten Bergdoll Naomi, RN 03/13/2017, 11:34 AM

## 2017-03-13 NOTE — Progress Notes (Signed)
PHARMACY CONSULT NOTE FOR:  OUTPATIENT  PARENTERAL ANTIBIOTIC THERAPY (OPAT)  Indication: Osteo Regimen: Rocephin 2g IV q 24h, Vancomycin 750mg  IV q 8 hrs (subject to change with level 2/8) End date 05/07/2017:   **Cannot put in OPAT orders for Rifampin since this is ORAL and not management by home health.  IV antibiotic discharge orders are pended. To discharging provider:  please sign these orders via discharge navigator,  Select New Orders & click on the button choice - Manage This Unsigned Work.     Thank you for allowing pharmacy to be a part of this patient's care.  Jason SpillersRobertson, Jason Maxwell 03/13/2017, 1:45 PM

## 2017-03-13 NOTE — Progress Notes (Signed)
Subjective: No new complaints   Antibiotics:  Anti-infectives (From admission, onward)   Start     Dose/Rate Route Frequency Ordered Stop   03/13/17 0100  vancomycin (VANCOCIN) IVPB 750 mg/150 ml premix     750 mg 150 mL/hr over 60 Minutes Intravenous Every 8 hours 03/12/17 1631     03/12/17 1600  cefTRIAXone (ROCEPHIN) 2 g in dextrose 5 % 50 mL IVPB     2 g 100 mL/hr over 30 Minutes Intravenous Every 24 hours 03/12/17 1319     03/12/17 1545  vancomycin (VANCOCIN) IVPB 1000 mg/200 mL premix     1,000 mg 200 mL/hr over 60 Minutes Intravenous  Once 03/12/17 1534 03/12/17 1812   03/12/17 1037  vancomycin (VANCOCIN) powder  Status:  Discontinued       As needed 03/12/17 1037 03/12/17 1145   03/12/17 1037  tobramycin (NEBCIN) powder  Status:  Discontinued       As needed 03/12/17 1038 03/12/17 1145   03/12/17 0726  ceFAZolin (ANCEF) 2-4 GM/100ML-% IVPB    Comments:  [name], [name]   : cabinet override      03/12/17 0726 03/12/17 0858   03/12/17 0722  ceFAZolin (ANCEF) IVPB 2g/100 mL premix     2 g 200 mL/hr over 30 Minutes Intravenous On call to O.R. 03/12/17 9179 03/12/17 0908      Medications: Scheduled Meds: . acetaminophen  1,000 mg Oral Q6H  . docusate sodium  100 mg Oral BID  . enoxaparin (LOVENOX) injection  30 mg Subcutaneous Q12H  . ketorolac  15 mg Intravenous Q6H  . senna  1 tablet Oral BID   Continuous Infusions: . cefTRIAXone (ROCEPHIN)  IV Stopped (03/12/17 1636)  . methocarbamol (ROBAXIN)  IV    . vancomycin 750 mg (03/13/17 0951)   PRN Meds:.acetaminophen **OR** acetaminophen, diphenhydrAMINE, methocarbamol **OR** methocarbamol (ROBAXIN)  IV, morphine injection, oxyCODONE    Objective: Weight change: -2.3 oz (-0.064 kg)  Intake/Output Summary (Last 24 hours) at 03/13/2017 1151 Last data filed at 03/12/2017 1650 Gross per 24 hour  Intake 350 ml  Output 125 ml  Net 225 ml   Blood pressure 106/60, pulse 73, temperature 97.9 F (36.6 C),  temperature source Oral, resp. rate 18, height 5' 0.75" (1.543 m), weight 163 lb (73.9 kg), SpO2 100 %. Temp:  [97.5 F (36.4 C)-98.8 F (37.1 C)] 97.9 F (36.6 C) (02/07 0507) Pulse Rate:  [73-102] 73 (02/07 0507) Resp:  [15-20] 18 (02/07 0507) BP: (99-137)/(7-82) 106/60 (02/07 0507) SpO2:  [97 %-100 %] 100 % (02/07 0507)  Physical Exam: General: Alert and awake, oriented x3, not in any acute distress. HEENT: anicteric sclera,EOMI CVS regular rate, normal r,   Chest: no wheezing, resp distress Abdomen: soft nontender, nondistended,  Extremities: left arm in sling Neuro: nonfocal  CBC: CBC Latest Ref Rng & Units 03/13/2017 03/12/2017 03/12/2017  WBC 4.0 - 10.5 K/uL 10.9(H) 11.5(H) 7.6  Hemoglobin 13.0 - 17.0 g/dL 12.4(L) 13.8 14.4  Hematocrit 39.0 - 52.0 % 36.4(L) 39.2 41.8  Platelets 150 - 400 K/uL 212 221 238      BMET Recent Labs    03/12/17 1500 03/13/17 0651  NA  --  139  K  --  3.8  CL  --  105  CO2  --  24  GLUCOSE  --  113*  BUN  --  13  CREATININE 1.01 1.03  CALCIUM  --  8.7*     Liver Panel  No results for input(s): PROT, ALBUMIN, AST, ALT, ALKPHOS, BILITOT, BILIDIR, IBILI in the last 72 hours.     Sedimentation Rate Recent Labs    03/13/17 0651  ESRSEDRATE 11   C-Reactive Protein Recent Labs    03/12/17 0736 03/13/17 0651  CRP 2.2* 3.4*    Micro Results: No results found for this or any previous visit (from the past 720 hour(s)).  Studies/Results: Dg Humerus Left  Result Date: 03/12/2017 CLINICAL DATA:  Postop EXAM: LEFT HUMERUS - 2+ VIEW COMPARISON:  12/03/2016 FINDINGS: Interval resection of a portion of the mid left humerus and placement of intramedullary nail and spacer, possibly antibiotic spacer. No hardware complicating feature. IMPRESSION: Postoperative changes as above. Electronically Signed   By: Rolm Baptise M.D.   On: 03/12/2017 12:36   Dg Humerus Left  Result Date: 03/12/2017 CLINICAL DATA:  Left humeral IM nail EXAM: DG  C-ARM 61-120 MIN; LEFT HUMERUS - 2+ VIEW COMPARISON:  None. FINDINGS: Multiple intraoperative spot images demonstrate resection of the portion of the mid left humerus, placement of left humeral intramedullary nail and spacer material. No hardware complicating feature. IMPRESSION: IM nail placement in the left humerus as above. Electronically Signed   By: Rolm Baptise M.D.   On: 03/12/2017 12:17   Dg C-arm 1-60 Min  Result Date: 03/12/2017 CLINICAL DATA:  Left humeral IM nail EXAM: DG C-ARM 61-120 MIN; LEFT HUMERUS - 2+ VIEW COMPARISON:  None. FINDINGS: Multiple intraoperative spot images demonstrate resection of the portion of the mid left humerus, placement of left humeral intramedullary nail and spacer material. No hardware complicating feature. IMPRESSION: IM nail placement in the left humerus as above. Electronically Signed   By: Rolm Baptise M.D.   On: 03/12/2017 12:17   Dg C-arm 1-60 Min  Result Date: 03/12/2017 CLINICAL DATA:  Left humeral IM nail EXAM: DG C-ARM 61-120 MIN; LEFT HUMERUS - 2+ VIEW COMPARISON:  None. FINDINGS: Multiple intraoperative spot images demonstrate resection of the portion of the mid left humerus, placement of left humeral intramedullary nail and spacer material. No hardware complicating feature. IMPRESSION: IM nail placement in the left humerus as above. Electronically Signed   By: Rolm Baptise M.D.   On: 03/12/2017 12:17      Assessment/Plan:  INTERVAL HISTORY: ALL MATERIAL WAS SENT TO [facility] AND NONE TO MICRO!!!   Active Problems:   Osteomyelitis of left humerus (HCC)   Fracture   Humerus fracture    Jason Maxwell is a 32 y.o. male with with chronic osteomyelitis associated with hardware with draining sinuses x 6 months while in [name], moved to [place] ultimately has had Disphysectomy of humerus for osteomyelitis , Intramedullary nailing of left humerus, Insertion of antibiotic cement spacer.   Unfortunately all of his infected bone and tissue that was  meticulously removed was sent to pathology ALONE  This has been a recurring problem I have been encountering in our health care system and I will try to see if there is a systems approach that can be implemented to prevent this type of thing happending. Surely there can be multiple double checks on cultures being sent.  My only option to discover the micro-organisms involved is to send the tissue to [facility] for PCR  This is a VERY expensive test and will not give me ANY data on antimicrobial S  Getting the micro done correctly was a very inexpensive BUT CRITICAL component to his global care  I would not he grew a MRSA that was R  to tetracyclines, FQ, TMP/SMX but S to clindamycin which may need to be our go to chronic oral antibiotic  For now we will go with IV ceftriaxone and Vancomycin  I will put in OPAT consult as well  He does need to be able to get these antibiotics safely at home otherwise he wiould need to go to SNF  Dr. 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 Pork-Derived Products     Religious reasons     OPAT Orders Discharge antibiotics: Ceftriaxone 2 grams daily and vancomycin per pharmacy Per pharmacy protocol vancomycin + rifampin orallly 340m po BID  Aim for Vancomycin trough 15-20 (unless otherwise indicated)  Duration: 8 weeks   End Date:  April 3rd  [facility]:  Labs while on IV antibiotics:  BIWEEKLY   _x_ BMP w GFR  WEEKLY:  _x_ CBC with differential x__ LFT's x__ CRP x__ ESR x__ Vancomycin trough  __ Please pull PIC at completion of IV antibiotics _x_ Please leave PIC in  place until doctor has seen patient or been notified  Fax weekly labs to [phone] Clinic Follow Up Appt:  Next 4 weeks  I spent greater than 40 minutes with the patient including greater than 50% of time in face to face counsel of the patient using FPakistantranslator re risk he had of losing his arm and need to follow him closely and risks and consqeuences of failure including sepsis and dissemination of infection and in coordination of his care with Dr. HDoreatha Martin  The patient does not have realistic understanding of the outcome despite extensive explanations by Dr. HDoreatha Martin He somehow thinks he will regain function of his hand.   I will arrange hospital followup in our clinic. -      LOS: 1 day   CAlcide Evener2/08/2017, 11:51 AM

## 2017-03-14 DIAGNOSIS — B9562 Methicillin resistant Staphylococcus aureus infection as the cause of diseases classified elsewhere: Secondary | ICD-10-CM

## 2017-03-14 DIAGNOSIS — Z1629 Resistance to other single specified antibiotic: Secondary | ICD-10-CM

## 2017-03-14 DIAGNOSIS — Z95828 Presence of other vascular implants and grafts: Secondary | ICD-10-CM

## 2017-03-14 LAB — HCV COMMENT:

## 2017-03-14 LAB — VANCOMYCIN, TROUGH: Vancomycin Tr: 11 ug/mL — ABNORMAL LOW (ref 15–20)

## 2017-03-14 LAB — HEPATITIS B SURFACE ANTIGEN: HEP B S AG: NEGATIVE

## 2017-03-14 LAB — HEPATITIS C ANTIBODY (REFLEX)

## 2017-03-14 MED ORDER — CEFTRIAXONE IV (FOR PTA / DISCHARGE USE ONLY): 2.0000 g | INTRAVENOUS | 0 refills | Status: DC

## 2017-03-14 MED ORDER — HYDROCODONE-ACETAMINOPHEN 5-325 MG PO TABS: 1.0000 | ORAL_TABLET | ORAL | 0 refills | Status: DC | PRN

## 2017-03-14 MED ORDER — VANCOMYCIN IV (FOR PTA / DISCHARGE USE ONLY): 1000.0000 mg | Freq: Three times a day (TID) | INTRAVENOUS | 0 refills | Status: DC

## 2017-03-14 MED ORDER — SODIUM CHLORIDE 0.9% FLUSH: 10.0000 mL | INTRAVENOUS | Status: DC | PRN

## 2017-03-14 MED ORDER — RIFAMPIN 300 MG PO CAPS: 300.0000 mg | ORAL_CAPSULE | Freq: Two times a day (BID) | ORAL | 0 refills | Status: DC

## 2017-03-14 MED ORDER — VANCOMYCIN HCL IN DEXTROSE 1-5 GM/200ML-% IV SOLN
1000.0000 mg | Freq: Three times a day (TID) | INTRAVENOUS | Status: DC
  Administered 2017-03-14: 1000 mg via INTRAVENOUS
  Filled 2017-03-14 (×3): qty 200

## 2017-03-14 NOTE — Anesthesia Postprocedure Evaluation (Signed)
Anesthesia Post Note  Patient: Moises Washington  Procedure(s) Performed: INTRAMEDULLARY (IM) NAIL LEFT HUMERAL (Left ) IRRIGATION AND DEBRIDEMENT LEFT HUMEROUS WOUND (Left )     Patient location during evaluation: PACU Anesthesia Type: General Level of consciousness: awake and alert Pain management: pain level controlled Vital Signs Assessment: post-procedure vital signs reviewed and stable Respiratory status: spontaneous breathing, nonlabored ventilation, respiratory function stable and patient connected to nasal cannula oxygen Cardiovascular status: blood pressure returned to baseline and stable Postop Assessment: no apparent nausea or vomiting Anesthetic complications: no    Last Vitals:  Vitals:   03/14/17 0438 03/14/17 0800  BP: 120/70 130/84  Pulse: 65   Resp: 18   Temp: 36.6 C 37.1 C  SpO2: 100% 100%    Last Pain:  Vitals:   03/14/17 1228  TempSrc:   PainSc: 0-No pain                 Tonesha Tsou EDWARD

## 2017-03-14 NOTE — Progress Notes (Signed)
Peripherally Inserted Central Catheter/Midline Placement  The IV Nurse has discussed with the patient and/or persons authorized to consent for the patient, the purpose of this procedure and the potential benefits and risks involved with this procedure.  The benefits include less needle sticks, lab draws from the catheter, and the patient may be discharged home with the catheter. Risks include, but not limited to, infection, bleeding, blood clot (thrombus formation), and puncture of an artery; nerve damage and irregular heartbeat and possibility to perform a PICC exchange if needed/ordered by physician.  Alternatives to this procedure were also discussed.  Bard Power PICC patient education guide, fact sheet on infection prevention and patient information card has been provided to patient /or left at bedside.    PICC/Midline Placement Documentation  PICC Single Lumen 03/14/17 PICC Right (Active)       Stacie GlazeJoyce, Alyia Lacerte Horton 03/14/2017, 2:32 PM

## 2017-03-14 NOTE — Progress Notes (Signed)
Subjective: No new complaints   Antibiotics:  Anti-infectives (From admission, onward)   Start     Dose/Rate Route Frequency Ordered Stop   03/14/17 1100  vancomycin (VANCOCIN) IVPB 1000 mg/200 mL premix     1,000 mg 200 mL/hr over 60 Minutes Intravenous 3 times daily 03/14/17 1015     03/14/17 0000  cefTRIAXone (ROCEPHIN) IVPB     2 g Intravenous Every 24 hours 03/14/17 0801 05/08/17 2359   03/14/17 0000  rifampin (RIFADIN) 300 MG capsule     300 mg Oral Every 12 hours 03/14/17 0806 05/08/17 2359   03/14/17 0000  vancomycin IVPB     1,000 mg Intravenous Every 8 hours 03/14/17 1148 05/08/17 2359   03/13/17 1215  rifampin (RIFADIN) capsule 300 mg     300 mg Oral Every 12 hours 03/13/17 1210     03/13/17 0100  vancomycin (VANCOCIN) IVPB 750 mg/150 ml premix  Status:  Discontinued     750 mg 150 mL/hr over 60 Minutes Intravenous Every 8 hours 03/12/17 1631 03/14/17 1015   03/12/17 1600  cefTRIAXone (ROCEPHIN) 2 g in dextrose 5 % 50 mL IVPB     2 g 100 mL/hr over 30 Minutes Intravenous Every 24 hours 03/12/17 1319     03/12/17 1545  vancomycin (VANCOCIN) IVPB 1000 mg/200 mL premix     1,000 mg 200 mL/hr over 60 Minutes Intravenous  Once 03/12/17 1534 03/12/17 1812   03/12/17 1037  vancomycin (VANCOCIN) powder  Status:  Discontinued       As needed 03/12/17 1037 03/12/17 1145   03/12/17 1037  tobramycin (NEBCIN) powder  Status:  Discontinued       As needed 03/12/17 1038 03/12/17 1145   03/12/17 0726  ceFAZolin (ANCEF) 2-4 GM/100ML-% IVPB    Comments:  [name], [name]   : cabinet override      03/12/17 0726 03/12/17 0858   03/12/17 0722  ceFAZolin (ANCEF) IVPB 2g/100 mL premix     2 g 200 mL/hr over 30 Minutes Intravenous On call to O.R. 03/12/17 9373 03/12/17 0908      Medications: Scheduled Meds: . acetaminophen  1,000 mg Oral Q6H  . docusate sodium  100 mg Oral BID  . enoxaparin (LOVENOX) injection  30 mg Subcutaneous Q12H  . ketorolac  15 mg Intravenous Q6H  .  rifampin  300 mg Oral Q12H  . senna  1 tablet Oral BID   Continuous Infusions: . cefTRIAXone (ROCEPHIN)  IV Stopped (03/14/17 1700)  . methocarbamol (ROBAXIN)  IV    . vancomycin 1,000 mg (03/14/17 1854)   PRN Meds:.acetaminophen **OR** acetaminophen, diphenhydrAMINE, methocarbamol **OR** methocarbamol (ROBAXIN)  IV, morphine injection, oxyCODONE, sodium chloride flush    Objective: Weight change:   Intake/Output Summary (Last 24 hours) at 03/14/2017 1854 Last data filed at 03/14/2017 1349 Gross per 24 hour  Intake 480 ml  Output -  Net 480 ml   Blood pressure 132/69, pulse 77, temperature 98 F (36.7 C), temperature source Oral, resp. rate 18, height 5' 0.75" (1.543 m), weight 163 lb (73.9 kg), SpO2 100 %. Temp:  [97.8 F (36.6 C)-98.8 F (37.1 C)] 98 F (36.7 C) (02/08 1600) Pulse Rate:  [65-97] 77 (02/08 1600) Resp:  [18] 18 (02/08 1600) BP: (120-138)/(69-86) 132/69 (02/08 1600) SpO2:  [100 %] 100 % (02/08 1600)  Physical Exam: General: Alert and awake, oriented x3, not in any acute distress. HEENT: anicteric sclera,EOMI CVS regular rate, normal r,  Chest: no wheezing, resp distress Abdomen: soft nontender, nondistended,  Extremities: left arm in sling Neuro: nonfocal  CBC: CBC Latest Ref Rng & Units 03/13/2017 03/12/2017 03/12/2017  WBC 4.0 - 10.5 K/uL 10.9(H) 11.5(H) 7.6  Hemoglobin 13.0 - 17.0 g/dL 12.4(L) 13.8 14.4  Hematocrit 39.0 - 52.0 % 36.4(L) 39.2 41.8  Platelets 150 - 400 K/uL 212 221 238      BMET Recent Labs    03/12/17 1500 03/13/17 0651  NA  --  139  K  --  3.8  CL  --  105  CO2  --  24  GLUCOSE  --  113*  BUN  --  13  CREATININE 1.01 1.03  CALCIUM  --  8.7*     Liver Panel  No results for input(s): PROT, ALBUMIN, AST, ALT, ALKPHOS, BILITOT, BILIDIR, IBILI in the last 72 hours.     Sedimentation Rate Recent Labs    03/13/17 0651  ESRSEDRATE 11   C-Reactive Protein Recent Labs    03/12/17 0736 03/13/17 0651  CRP 2.2* 3.4*      Micro Results: No results found for this or any previous visit (from the past 720 hour(s)).  Studies/Results: No results found.    Assessment/Plan:  INTERVAL HISTORY: ALL MATERIAL WAS SENT TO [facility] AND NONE TO MICRO!!! And NONE can be sent to [facility] either at this point   Active Problems:   Osteomyelitis of left humerus (HCC)   Fracture   Humerus fracture   MRSA infection    Jason Maxwell is a 32 y.o. male with with chronic osteomyelitis associated with hardware with draining sinuses x 6 months while in [name], moved to [place] ultimately has had Disphysectomy of humerus for osteomyelitis , Intramedullary nailing of left humerus, Insertion of antibiotic cement spacer.   Unfortunately all of his infected bone and tissue that was meticulously removed was sent to pathology ALONE  This has been a recurring problem I have been encountering in our health care system and I will try to see if there is a systems approach that can be implemented to prevent this type of thing happending. Surely there can be multiple double checks on cultures being sent.  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 Pork-Derived Products     Religious reasons     OPAT Orders Discharge antibiotics: Ceftriaxone 2 grams daily and vancomycin per pharmacy Per pharmacy protocol vancomycin + rifampin orallly 3052mpo  BID  Aim for Vancomycin trough 15-20 (unless otherwise indicated)  Duration: 8 weeks   End Date:  April 3rd  [facility]  Labs while on IV antibiotics:  BIWEEKLY   _x_ BMP w GFR  WEEKLY:  _x_ CBC with differential x__ LFT's x__ CRP x__ ESR x__ Vancomycin trough  __ Please pull PIC at completion of IV  antibiotics _x_ Please leave PIC in place until doctor has seen patient or been notified  Fax weekly labs to [phone]  Clinic Follow Up Appt:  Next 4 weeks  I spent greater than 40 minutes with the patient including greater than 50% of time in face to face counsel of the patient and his brother regarding what it happened with the microbiology material being placed in formalin and the need for empiric antibiotics and for patient to follow-up very closely with both Dr. Doreatha Martin and myself and in coordination of  His care with pathology  I will arrange hospital followup in our clinic.   I am otherwise signing off   Please call with further questions.       LOS: 2 days   Alcide Evener 03/14/2017, 6:54 PM

## 2017-03-14 NOTE — Progress Notes (Signed)
Talked with family member that is listed as emergency contact, and informed them that patient would be ready for discharge this evening. Family member stated that they would not be available until late this evening due to [place] from [place]. Will pass on to night shift nurse.

## 2017-03-14 NOTE — Progress Notes (Signed)
[facility]  I have met with pt and sister in law  [place], and provided 2 hands on teaching sessions for the IV ABX administration.  Jason Maxwell did very well with return demonstration of full set and administration of both Rocephin and Vancomycin.   [facility] is prepared for DC home over the weekend.  I have discussed with Jason Maxwell that pt will receive 10 A and 4 P doses at the hospital and she will independently administer the 10 PM dose at home. She works during the day on Saturday.  [facility] will plan for admission visit on Sunday AM.  If patient discharges after hours, please call [phone].   Larry Sierras 03/14/2017, 1:47 PM

## 2017-03-14 NOTE — Discharge Instructions (Signed)
[facility] Discharge Instructions   General Discharge Instructions  WEIGHT BEARING STATUS: Nonweight bearing to left arm  RANGE OF MOTION/ACTIVITY: No restrictioons  Wound Care: As noted below  DVT/PE prophylaxis: None needed  Diet: as you were eating previously.  Can use over the counter stool softeners and bowel preparations, such as Miralax, to help with bowel movements.  Narcotics can be constipating.  Be sure to drink plenty of fluids  PAIN MEDICATION USE AND EXPECTATIONS  You have likely been given narcotic medications to help control your pain.  After a traumatic event that results in an fracture (broken bone) with or without surgery, it is ok to use narcotic pain medications to help control one's pain.  We understand that everyone responds to pain differently and each individual patient will be evaluated on a regular basis for the continued need for narcotic medications. Ideally, narcotic medication use should last no more than 6-8 weeks (coinciding with fracture healing).   As a patient it is your responsibility as well to monitor narcotic medication use and report the amount and frequency you use these medications when you come to your office visit.   We would also advise that if you are using narcotic medications, you should take a dose prior to therapy to maximize you participation.  IF YOU ARE ON NARCOTIC MEDICATIONS IT IS NOT PERMISSIBLE TO OPERATE A MOTOR VEHICLE (MOTORCYCLE/CAR/TRUCK/MOPED) OR HEAVY MACHINERY DO NOT MIX NARCOTICS WITH OTHER CNS (CENTRAL NERVOUS SYSTEM) DEPRESSANTS SUCH AS ALCOHOL   STOP SMOKING OR USING NICOTINE PRODUCTS!!!!  As discussed nicotine severely impairs your body's ability to heal surgical and traumatic wounds but also impairs bone healing.  Wounds and bone heal by forming microscopic blood vessels (angiogenesis) and nicotine is a vasoconstrictor (essentially, shrinks blood vessels).  Therefore, if vasoconstriction occurs to these  microscopic blood vessels they essentially disappear and are unable to deliver necessary nutrients to the healing tissue.  This is one modifiable factor that you can do to dramatically increase your chances of healing your injury.    (This means no smoking, no nicotine gum, patches, etc)  DO NOT USE NONSTEROIDAL ANTI-INFLAMMATORY DRUGS (NSAID'S)  Using products such as Advil (ibuprofen), Aleve (naproxen), Motrin (ibuprofen) for additional pain control during fracture healing can delay and/or prevent the healing response.  If you would like to take over the counter (OTC) medication, Tylenol (acetaminophen) is ok.  However, some narcotic medications that are given for pain control contain acetaminophen as well. Therefore, you should not exceed more than 4000 mg of tylenol in a day if you do not have liver disease.  Also note that there are may OTC medicines, such as cold medicines and allergy medicines that my contain tylenol as well.  If you have any questions about medications and/or interactions please ask your doctor/PA or your pharmacist.      ICE AND ELEVATE INJURED/OPERATIVE EXTREMITY  Using ice and elevating the injured extremity above your heart can help with swelling and pain control.  Icing in a pulsatile fashion, such as 20 minutes on and 20 minutes off, can be followed.    Do not place ice directly on skin. Make sure there is a barrier between to skin and the ice pack.    Using frozen items such as frozen peas works well as the conform nicely to the are that needs to be iced.  USE AN ACE WRAP OR TED HOSE FOR SWELLING CONTROL  In addition to icing and elevation, Ace wraps or TED  hose are used to help limit and resolve swelling.  It is recommended to use Ace wraps or TED hose until you are informed to stop.    When using Ace Wraps start the wrapping distally (farthest away from the body) and wrap proximally (closer to the body)   Example: If you had surgery on your leg or thing and you do not  have a splint on, start the ace wrap at the toes and work your way up to the thigh        If you had surgery on your upper extremity and do not have a splint on, start the ace wrap at your fingers and work your way up to the upper arm  CALL THE OFFICE WITH ANY QUESTIONS OR CONCERNS: [phone]    Discharge Wound Care Instructions  Do NOT apply any ointments, solutions or lotions to pin sites or surgical wounds.  These prevent needed drainage and even though solutions like hydrogen peroxide kill bacteria, they also damage cells lining the pin sites that help fight infection.  Applying lotions or ointments can keep the wounds moist and can cause them to breakdown and open up as well. This can increase the risk for infection. When in doubt call the office.  Surgical incisions should be dressed daily.  If any drainage is noted, use one layer of adaptic, then gauze, Kerlix, and an ace wrap.  Once the incision is completely dry and without drainage, it may be left open to air out.  Showering may begin 36-48 hours later.  Cleaning gently with soap and water.  Traumatic wounds should be dressed daily as well.    One layer of adaptic, gauze, Kerlix, then ace wrap.  The adaptic can be discontinued once the draining has ceased    If you have a wet to dry dressing: wet the gauze with saline the squeeze as much saline out so the gauze is moist (not soaking wet), place moistened gauze over wound, then place a dry gauze over the moist one, followed by Kerlix wrap, then ace wrap.

## 2017-03-14 NOTE — Progress Notes (Signed)
Orthopaedic Trauma Progress Note  S: No issues overnight.  Pain well controlled.  Exam performed with use of interpreter.  O:  Vitals:   03/14/17 0438 03/14/17 0800  BP: 120/70 130/84  Pulse: 65   Resp: 18   Temp: 97.8 F (36.6 C) 98.8 F (37.1 C)  SpO2: 100% 100%   Well-appearing no acute distress.  Left arm dressing taken down.  Wounds are clean dry and intact.  No signs of active drainage.  Motor function at baseline.  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 Zenaida NieceVan dam -Plan for every other day dressing changes upon discharge. -PICC line placement today -Possible discharge today versus tomorrow.  Roby LoftsKevin P. Darcy Cordner, MD [facility] [phone] (phone)

## 2017-03-14 NOTE — Progress Notes (Signed)
PHARMACY CONSULT NOTE FOR:  OUTPATIENT  PARENTERAL ANTIBIOTIC THERAPY (OPAT)  Indication: Osteo Regimen: Rocephin 2g IV q 24h, Vancomycin increased to 1000mg  IV q 8 hrs based on vanc trough on 2/8 End date 05/07/2017:   **Cannot put in OPAT orders for Rifampin since this is ORAL and not management by home health.  IV antibiotic discharge orders are pended. To discharging provider:  please sign these orders via discharge navigator,  Select New Orders & click on the button choice - Manage This Unsigned Work.     Thank you for allowing pharmacy to be a part of this patient's care.  Vinnie LevelBenjamin Blake Vetrano, PharmD., BCPS Clinical Pharmacist Pager [phone]

## 2017-03-14 NOTE — Progress Notes (Signed)
Pharmacy Antibiotic Note  Jason Maxwell is a 32 y.o. 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  Pharmacy managing vancomycin dosing.  Per ID note, planning 6-8 weeks of IV antibiotics then oral antibiotics. A vanc trough drawn today was subtherapeutic at 11 (~7hr level)  Plan: 1. Increase Vancomycin to 1000 mg IV q 8 hrs. 2. Ceftriaxone 2g IV q 24 hrs. 3. Check vancomycin trough at steady state as indicated.  Height: 5' 0.75" (154.3 cm) Weight: 163 lb (73.9 kg) IBW/kg (Calculated) : 51.73  Temp (24hrs), Avg:97.9 F (36.6 C), Min:97.1 F (36.2 C), Max:98.8 F (37.1 C)  Recent Labs  Lab 03/12/17 0736 03/12/17 1500 03/13/17 0651 03/14/17 0816  WBC 7.6 11.5* 10.9*  --   CREATININE  --  1.01 1.03  --   VANCOTROUGH  --   --   --  11*    Estimated Creatinine Clearance: 89.1 mL/min (by C-G formula based on SCr of 1.03 mg/dL).    Allergies  Allergen Reactions  . Pork-Derived Products     Religious reasons     Antimicrobials this admission: Vancomycin 2/6 > Ceftriaxone 2/6 >  Dose adjustments this admission: 2/8 7hr Vanc trough 11 on vanc 750 mg IV Q 8 hours > 1000 mg q 8 hours   Microbiology results:  BCx:   UCx:    Sputum:    MRSA PCR:   Thank you for allowing pharmacy to be a part of this patient's care.  [name], PharmD., BCPS Clinical Pharmacist Pager [phone]

## 2017-03-14 NOTE — Discharge Summary (Signed)
Orthopaedic Trauma Service (OTS)  Patient ID: Jason Maxwell MRN: [id number] DOB/AGE: [birth year] 32 y.o.  Admit date: 03/12/2017 Discharge date: 03/14/2017  Admission Diagnoses: Osteomyelitis of left humerus [facility])   Fracture   Humerus fracture   MRSA infection  Discharge Diagnoses:  Active Problems:   Osteomyelitis of left humerus (HCC)   Fracture   Humerus fracture   MRSA infection   History reviewed. No pertinent past medical history.  Procedures Performed: 1. CPT 24134-Disphysectomy of humerus for osteomyelitis 2. CPT 24516-Intramedullary nailing of left humerus 3. CPT 11981-Insertion of antibiotic cement spacer  Discharged Condition: good  Hospital Course: The patient was admitted following the above procedure.  He was started on broad-spectrum antibiotics.  Infectious disease with Dr. was consulted. [facility] he assisted with antibiotic regimen as well as lab work.  The patient got a PICC line on postoperative day 2.  He was tolerating regular diet, voiding spontaneously, pain was well controlled upon discharge.  His dressing was changed on postoperative day 2.  There is no signs of any drainage or infection.  The patient was discharged home on postoperative day 2 with home health IV antibiotics.  Consults: ID  Significant Diagnostic Studies: Pathology specimens that were not sent to microbiology.  Treatments: surgery: As noted above  Discharge Exam: Vitals:   03/14/17 0438 03/14/17 0800  BP: 120/70 130/84  Pulse: 65   Resp: 18   Temp: 97.8 F (36.6 C) 98.8 F (37.1 C)  SpO2: 100% 100%   Well-appearing no acute distress.  Left arm dressing taken down.  Wounds are clean dry and intact.  No signs of active drainage.  Motor function at baseline.   Disposition: 01-Home or Self Care  Discharge Instructions    Home infusion instructions [facility] May follow [place] Dosing Protocol; May administer Cathflo as needed to maintain patency of vascular  access device.; Flushing of vascular access device: per [facility] Protocol: 0.9% NaCl pre/post medica...   Complete by:  As directed    Instructions:  May follow [place] Dosing Protocol   Instructions:  May administer Cathflo as needed to maintain patency of vascular access device.   Instructions:  Flushing of vascular access device: per [facility] Protocol: 0.9% NaCl pre/post medication administration and prn patency; Heparin 100 u/ml, 69m for implanted ports and Heparin 10u/ml, 546mfor all other central venous catheters.   Instructions:  May follow [facility] Anaphylaxis Protocol for First Dose Administration in the home: 0.9% NaCl at 25-50 ml/hr to maintain IV access for protocol meds. Epinephrine 0.3 ml IV/IM PRN and Benadryl 25-50 IV/IM PRN s/s of anaphylaxis.   Instructions:  [facility] Coordinator (RN) to assist per patient IV care needs in the home PRN.   Home infusion instructions [facility] May follow [place] Protocol; May administer Cathflo as needed to maintain patency of vascular access device.; Flushing of vascular access device: [facility]: 0.9% NaCl pre/post medica...   Complete by:  As directed    Instructions:  May follow [facility] Protocol   Instructions:  May administer Cathflo as needed to maintain patency of vascular access device.   Instructions:  Flushing of vascular access device: per [facility]: 0.9% NaCl pre/post medication administration and prn patency; Heparin 100 u/ml, 59m58mor implanted ports and Heparin 10u/ml, 59ml41mr all other central venous catheters.   Instructions:  May follow [facility] Anaphylaxis Protocol for First Dose Administration in the home: 0.9% NaCl at 25-50 ml/hr to maintain  IV access for protocol meds. Epinephrine 0.3 ml IV/IM PRN and Benadryl 25-50 IV/IM PRN s/s of anaphylaxis.   Instructions:  [place] Infusion Coordinator (RN) to assist per patient IV care needs in the home PRN.     Allergies as of  03/14/2017      Reactions   Pork-derived Products    Religious reasons       Medication List    STOP taking these medications   acetaminophen 650 MG CR tablet Commonly known as:  TYLENOL   doxycycline 100 MG capsule Commonly known as:  VIBRAMYCIN   ibuprofen 600 MG tablet Commonly known as:  ADVIL,MOTRIN     TAKE these medications   cefTRIAXone IVPB Commonly known as:  ROCEPHIN Inject 2 g into the vein daily. Indication:  Osteo Last Day of Therapy:  05/07/2017 Labs - Once weekly:  CBC/D and BMP, Labs - Every other week:  ESR and CRP   HYDROcodone-acetaminophen 5-325 MG tablet Commonly known as:  NORCO/VICODIN Take 1-2 tablets by mouth every 4 (four) hours as needed for severe pain.   rifampin 300 MG capsule Commonly known as:  RIFADIN Take 1 capsule (300 mg total) by mouth every 12 (twelve) hours.   vancomycin IVPB Inject 1,000 mg into the vein every 8 (eight) hours. Indication:  Osteomyelitis  Last Day of Therapy:  05/07/2017 Labs - Sunday/Monday:  CBC/D, BMP, and vancomycin trough. Labs - Thursday:  BMP and vancomycin trough Labs - Every other week:  ESR and CRP            Home Infusion Instuctions  (From admission, onward)        Start     Ordered   03/14/17 0000  Home infusion instructions [facility] May follow [facility] Dosing Protocol; May administer Cathflo as needed to maintain patency of vascular access device.; Flushing of vascular access device: per [facility] Protocol: 0.9% NaCl pre/post medica...    Question Answer Comment  Instructions May follow [facility] Dosing Protocol   Instructions May administer Cathflo as needed to maintain patency of vascular access device.   Instructions Flushing of vascular access device: per [facility] Protocol: 0.9% NaCl pre/post medication administration and prn patency; Heparin 100 u/ml, 5ml for implanted ports and Heparin 10u/ml, 5ml for all other central venous catheters.   Instructions May follow [facility] Anaphylaxis  Protocol for First Dose Administration in the home: 0.9% NaCl at 25-50 ml/hr to maintain IV access for protocol meds. Epinephrine 0.3 ml IV/IM PRN and Benadryl 25-50 IV/IM PRN s/s of anaphylaxis.   Instructions [facility] Infusion Coordinator (RN) to assist per patient IV care needs in the home PRN.      02 /08/19 0801   03/14/17 0000  Home infusion instructions [facility] May follow [place] Dosing Protocol; May administer Cathflo as needed to maintain patency of vascular access device.; Flushing of vascular access device: per [facility] Protocol: 0.9% NaCl pre/post medica...    Question Answer Comment  Instructions May follow [place] Dosing Protocol   Instructions May administer Cathflo as needed to maintain patency of vascular access device.   Instructions Flushing of vascular access device: per [facility] Protocol: 0.9% NaCl pre/post medication administration and prn patency; Heparin 100 u/ml, 55m for implanted ports and Heparin 10u/ml, 516mfor all other central venous catheters.   Instructions May follow [facility] Anaphylaxis Protocol for First Dose Administration in the home: 0.9% NaCl at 25-50 ml/hr to maintain IV access for protocol meds. Epinephrine 0.3 ml  IV/IM PRN and Benadryl 25-50 IV/IM PRN s/s of anaphylaxis.   Instructions [facility] Infusion Coordinator (RN) to assist per patient IV care needs in the home PRN.      03/14/17 1148     Follow-up Information    [facility], [facility]-Home Follow up.   Specialty:  [place] Why:  A representative from [place] will contact you to arrange start date and time for Home Health Nurse.  Contact information: [address] [place] [address] [phone]        Shona Needles, MD. Schedule an appointment as soon as possible for a visit in 2 week(s).   Specialty:  Orthopedic Surgery Contact information: [place] [address] [phone]            Discharge Instructions and Plan: Patient will be a nonweightbearing to left upper extremity.  He will will go home with IV antibiotics.  He will follow-up with me in 2 weeks for wound check and x-rays.  Signed:  Shona Needles, MD [facility] [phone] (phone) 03/14/2017, 11:59 AM

## 2017-03-15 NOTE — Progress Notes (Signed)
t was seen by IV team. An interpreter (ID# [phone]) was called to translate d/c summaries. Pt verbalizes understanding, all questions were answered. Family member at the bedside.

## 2017-03-17 ENCOUNTER — Encounter (HOSPITAL_COMMUNITY): Payer: Self-pay | Admitting: Student

## 2017-03-28 ENCOUNTER — Other Ambulatory Visit: Payer: Self-pay | Admitting: Pharmacist

## 2017-03-31 ENCOUNTER — Encounter: Payer: Self-pay | Admitting: Infectious Disease

## 2017-04-01 ENCOUNTER — Other Ambulatory Visit: Payer: Self-pay | Admitting: Pharmacist

## 2017-04-04 ENCOUNTER — Other Ambulatory Visit (HOSPITAL_COMMUNITY): Payer: Self-pay | Admitting: *Deleted

## 2017-04-07 ENCOUNTER — Ambulatory Visit (HOSPITAL_COMMUNITY)
Admission: RE | Admit: 2017-04-07 | Discharge: 2017-04-07 | Disposition: A | Discharge status: Home / Self Care | Payer: Self-pay | Source: Ambulatory Visit | Attending: Infectious Disease | Admitting: Infectious Disease

## 2017-04-07 DIAGNOSIS — B9562 Methicillin resistant Staphylococcus aureus infection as the cause of diseases classified elsewhere: Secondary | ICD-10-CM | POA: Insufficient documentation

## 2017-04-07 DIAGNOSIS — M869 Osteomyelitis, unspecified: Secondary | ICD-10-CM | POA: Insufficient documentation

## 2017-04-07 LAB — BASIC METABOLIC PANEL
ANION GAP: 9 (ref 5–15)
BUN: 9 mg/dL (ref 6–20)
CALCIUM: 9.3 mg/dL (ref 8.9–10.3)
CO2: 24 mmol/L (ref 22–32)
Chloride: 105 mmol/L (ref 101–111)
Creatinine, Ser: 0.93 mg/dL (ref 0.61–1.24)
GFR calc Af Amer: >60 mL/min (ref >60)
Glucose, Bld: 99 mg/dL (ref 65–99)
Potassium: 3.9 mmol/L (ref 3.5–5.1)
Sodium: 138 mmol/L (ref 135–145)

## 2017-04-07 LAB — CBC WITH DIFFERENTIAL/PLATELET
Basophils Absolute: 0 10*3/uL (ref 0.0–0.1)
Basophils Relative: 1 %
Eosinophils Absolute: 0.3 10*3/uL (ref 0.0–0.7)
Eosinophils Relative: 8 %
HEMATOCRIT: 36.8 % — AB (ref 39.0–52.0)
HEMOGLOBIN: 12.4 g/dL — AB (ref 13.0–17.0)
Lymphocytes Relative: 46 %
Lymphs Abs: 1.8 10*3/uL (ref 0.7–4.0)
MCH: 23 pg — AB (ref 26.0–34.0)
MCHC: 33.7 g/dL (ref 30.0–36.0)
MCV: 68.4 fL — AB (ref 78.0–100.0)
Monocytes Absolute: 0.5 10*3/uL (ref 0.1–1.0)
Monocytes Relative: 14 %
NEUTROS PCT: 31 %
Neutro Abs: 1.1 10*3/uL — ABNORMAL LOW (ref 1.7–7.7)
Platelets: 223 10*3/uL (ref 150–400)
RBC: 5.38 MIL/uL (ref 4.22–5.81)
RDW: 16.2 % — ABNORMAL HIGH (ref 11.5–15.5)
SMEAR REVIEW: ADEQUATE
WBC: 3.7 10*3/uL — AB (ref 4.0–10.5)

## 2017-04-07 LAB — SEDIMENTATION RATE: Sed Rate: 0 mm/hr (ref 0–16)

## 2017-04-07 LAB — C-REACTIVE PROTEIN: CRP: <0.8 mg/dL (ref <1.0)

## 2017-04-07 MED ORDER — ALTEPLASE 2 MG IJ SOLR
INTRAMUSCULAR | Status: AC
  Filled 2017-04-07: qty 2

## 2017-04-07 MED ORDER — ALTEPLASE 2 MG IJ SOLR
2.0000 mg | Freq: Once | INTRAMUSCULAR | Status: AC
  Administered 2017-04-07: 2 mg

## 2017-04-07 MED ORDER — HEPARIN SOD (PORK) LOCK FLUSH 100 UNIT/ML IV SOLN
INTRAVENOUS | Status: AC
  Filled 2017-04-07: qty 5

## 2017-04-09 ENCOUNTER — Telehealth: Payer: Self-pay | Admitting: Pharmacist

## 2017-04-09 ENCOUNTER — Other Ambulatory Visit: Payer: Self-pay | Admitting: Pharmacist

## 2017-04-09 DIAGNOSIS — M869 Osteomyelitis, unspecified: Secondary | ICD-10-CM

## 2017-04-09 MED ORDER — SODIUM CHLORIDE 0.9 % IV SOLN: 600.0000 mg | Freq: Two times a day (BID) | INTRAVENOUS | 0 refills | Status: DC

## 2017-04-09 MED ORDER — LEVOFLOXACIN 750 MG PO TABS: 750.0000 mg | ORAL_TABLET | Freq: Every day | ORAL | 0 refills | Status: DC

## 2017-04-09 NOTE — Telephone Encounter (Addendum)
Patient's wbc are trending down on vancomycin so Dr. Daiva EvesVan Dam wanted to see about changing patient to ceftaroline + oral FQ.  [facility] and we are able to do Ceftaroline 600 mg IV q12h. Gave verbal order to d/c vanc and ceftriaxone and start ceftaroline.  Called patient through interpreter line to discuss change and new oral antibiotic - no answer. No voicemail left at this time.  I will send in Levaquin 750 mg PO daily #30 with [facility] to last until his stop date of 4/3.  He has a f/u with Dr. Daiva EvesVan Dam next week on 3/13.  Will try to call him again this afternoon or tomorrow morning.

## 2017-04-09 NOTE — Progress Notes (Signed)
Yes. Checking with [facility] now regarding Teflaro.

## 2017-04-10 ENCOUNTER — Telehealth: Payer: Self-pay | Admitting: Behavioral Health

## 2017-04-10 NOTE — Telephone Encounter (Signed)
I was able to get in touch with Jason Maxwell through the interpreter.  Explained his new antibiotics to him. Told him to go [facility] and continue the rifampin.  He will see Dr. Daiva EvesVan Dam next Wednesday.

## 2017-04-10 NOTE — Telephone Encounter (Signed)
Returning a voicemail about questions about a medication regimen. Pharmacy called stating patient was there questioning if he needs to continue Rifampin.  Per notes from Pharmacist Cassie Kuppelweiser and Dr. Daiva EvesVan Dam patient is to start Levaquin and to continue Rifampin. Angeline SlimAshley Katlin Ciszewski RN

## 2017-04-10 NOTE — Telephone Encounter (Signed)
Thanks so much Cassie! 

## 2017-04-14 ENCOUNTER — Encounter: Payer: Self-pay | Admitting: Infectious Disease

## 2017-04-16 ENCOUNTER — Ambulatory Visit (INDEPENDENT_AMBULATORY_CARE_PROVIDER_SITE_OTHER): Payer: Self-pay | Admitting: Infectious Disease

## 2017-04-16 ENCOUNTER — Encounter: Payer: Self-pay | Admitting: Infectious Disease

## 2017-04-16 VITALS — BP 165/116 | HR 109 | Temp 98.2°F | Ht 71.0 in | Wt 189.0 lb

## 2017-04-16 DIAGNOSIS — M86422 Chronic osteomyelitis with draining sinus, left humerus: Secondary | ICD-10-CM

## 2017-04-16 DIAGNOSIS — S42322K Displaced transverse fracture of shaft of humerus, left arm, subsequent encounter for fracture with nonunion: Secondary | ICD-10-CM

## 2017-04-16 DIAGNOSIS — A4902 Methicillin resistant Staphylococcus aureus infection, unspecified site: Secondary | ICD-10-CM

## 2017-04-16 NOTE — Progress Notes (Signed)
HPI: Jason Maxwell is a 32 y.o. male who is here to f/u with Dr. Daiva EvesVan Dam for his osteomyelitis infection.   Allergies: Allergies  Allergen Reactions  . Pork-Derived Products     Religious reasons     Vitals: Temp: 98.2 F (36.8 C) (03/13 1412) Temp Source: Oral (03/13 1412) BP: 165/116 (03/13 1412) Pulse Rate: 109 (03/13 1412)  Past Medical History: No past medical history on file.  Social History: Social History   Socioeconomic History  . Marital status: Married    Spouse name: None  . Number of children: None  . Years of education: None  . Highest education level: None  Social Needs  . Financial resource strain: None  . Food insecurity - worry: None  . Food insecurity - inability: None  . Transportation needs - medical: None  . Transportation needs - non-medical: None  Occupational History  . None  Tobacco Use  . Smoking status: Former Smoker    Last attempt to quit: 03/12/2015    Years since quitting: 2.0  . Smokeless tobacco: Never Used  Substance and Sexual Activity  . Alcohol use: No  . Drug use: No  . Sexual activity: None  Other Topics Concern  . None  Social History Narrative  . None    Previous Regimen:   Current Regimen: Ceftaroline/rifampin/levaquin  Labs: Hepatitis B Surface Ag (no units)  Date Value  03/13/2017 Negative    CrCl: Estimated Creatinine Clearance: 122.6 mL/min (by C-G formula based on SCr of 0.93 mg/dL).  Lipids: No results found for: CHOL, TRIG, HDL, CHOLHDL, VLDL, LDLCALC  Assessment: Jason Maxwell is here to see Dr. Daiva EvesVan Dam for his chronic osteomyelitis of his arm. He is currently on Ceftaroline/rifampin/levaquin right now. He has had this issue for more than 6 months. The only culture that we got was the MRSA on the wound culture. Ultimately, he is uninsured and is getting abx covered through charity care right now. The plan is to continue IV abx until April and he will be transition to PO abx. He doesn't have any  coverage at this point. The translator stated that he is trying to apply for Medicaid. In the meantime, we are trying to figure out a way to get his PO meds once his IV is complete. He said that he can afford about $20/month. We decided to use Tedizolid through patient assistance. Amazingly, they approved him for 3 months of meds after 20 minutes.They will ship the medication here to the clinic so we can keep track of his labs.   Recommendations:  Finished out Ceftazoline/rifampin/levaqin Start tedizolid 1 daily post IV therapy  [name], PharmD, BCPS, AAHIVP, CPP Clinical Infectious Disease Pharmacist [facility] 04/16/2017, 3:16 PM

## 2017-04-16 NOTE — Progress Notes (Signed)
Subjective:   Chief complaint: he is having much less arm pain now   Patient ID: Jason Maxwell, male    DOB: 05/20/1985, 32 y.o.   MRN: [id number]  HPI  32 y.o. male with with chronic osteomyelitis associated with hardware with draining sinuses x 6 months [place] in [place] ultimately has had Disphysectomy of humerus for osteomyelitis , Intramedullary nailing of left humerus, Insertion of antibiotic cement spacer.   Unfortunately all of his infected bone and tissue that was meticulously removed was sent to pathology ALONE and placed in formalin. Therefore we could never get a culture to guide therapy.  We do have a clue to a potential organism based on a MDR MRSA that grew from culture from sinus in October, 2018.  We covered him with IV vancomcyin, Ceftriaxone and oral rifampin in the interim. We ran into issues with vancomycin and had to change him to Teflaro 600mg  IV q 12 hours and added in levaquin orally with continued oral rifampin.  He has had very nice improvement in his arm which he is able to swing around now. He says he can even participate in some sports which he could not previously do due to degree of pain in his arm. He has seen Dr. Jena GaussHaddix in followup who has liberated him from his sling.  He is religiously taking his IV antibiotics and oral antibiotics. The stop date for IV abx is April 3rd.  No past medical history on file.  Past Surgical History:  Procedure Laterality Date  . arm surgery Left   . FRACTURE SURGERY    . HERNIA REPAIR Right    inguinal   . HUMERUS IM NAIL Left 03/12/2017   Procedure: INTRAMEDULLARY (IM) NAIL LEFT HUMERAL;  Surgeon: Roby LoftsHaddix, Kevin P, MD;  Location: [facility] OR;  Service: Orthopedics;  Laterality: Left;  . IM NAILING HUMERUS Left 03/12/2017  . INCISION AND DRAINAGE OF WOUND Left 03/12/2017   Procedure: IRRIGATION AND DEBRIDEMENT LEFT HUMEROUS WOUND;  Surgeon: Roby LoftsHaddix, Kevin P, MD;  Location: [facility] OR;  Service: Orthopedics;  Laterality:  Left;    No family history on file.    Social History   Socioeconomic History  . Marital status: Married    Spouse name: None  . Number of children: None  . Years of education: None  . Highest education level: None  Social Needs  . Financial resource strain: None  . Food insecurity - worry: None  . Food insecurity - inability: None  . Transportation needs - medical: None  . Transportation needs - non-medical: None  Occupational History  . None  Tobacco Use  . Smoking status: Former Smoker    Last attempt to quit: 03/12/2015    Years since quitting: 2.0  . Smokeless tobacco: Never Used  Substance and Sexual Activity  . Alcohol use: No  . Drug use: No  . Sexual activity: None  Other Topics Concern  . None  Social History Narrative  . None    Allergies  Allergen Reactions  . Pork-Derived Products     Religious reasons      Current Outpatient Medications:  .  ceftaroline 600 mg in sodium chloride 0.9 % 250 mL, Inject 600 mg into the vein every 12 (twelve) hours., Disp: 30 vial, Rfl: 0 .  HYDROcodone-acetaminophen (NORCO/VICODIN) 5-325 MG tablet, Take 1-2 tablets by mouth every 4 (four) hours as needed for severe pain., Disp: 15 tablet, Rfl: 0 .  levofloxacin (LEVAQUIN) 750 MG tablet, Take 1  tablet (750 mg total) by mouth daily., Disp: 30 tablet, Rfl: 0 .  rifampin (RIFADIN) 300 MG capsule, Take 1 capsule (300 mg total) by mouth every 12 (twelve) hours., Disp: 110 capsule, Rfl: 0     Review of Systems  Constitutional: Negative for chills and fever.  HENT: Negative for congestion and sore throat.   Eyes: Negative for photophobia.  Respiratory: Negative for cough, shortness of breath and wheezing.   Cardiovascular: Negative for chest pain, palpitations and leg swelling.  Gastrointestinal: Negative for abdominal pain, blood in stool, constipation, diarrhea, nausea and vomiting.  Genitourinary: Negative for dysuria, flank pain and hematuria.  Musculoskeletal:  Positive for myalgias. Negative for back pain.  Skin: Positive for wound. Negative for rash.  Neurological: Positive for weakness. Negative for dizziness and headaches.  Hematological: Does not bruise/bleed easily.  Psychiatric/Behavioral: Negative for suicidal ideas.       Objective:   Physical Exam  Constitutional: He is oriented to person, place, and time. He appears well-developed and well-nourished. No distress.  HENT:  Head: Normocephalic and atraumatic.  Mouth/Throat: No oropharyngeal exudate.  Eyes: Conjunctivae and EOM are normal. No scleral icterus.  Neck: Normal range of motion. Neck supple.  Cardiovascular: Normal rate and regular rhythm.  Pulmonary/Chest: Effort normal. No respiratory distress. He has no wheezes.  Abdominal: He exhibits no distension.  Musculoskeletal: He exhibits no edema or tenderness.  Neurological: He is alert and oriented to person, place, and time. He exhibits normal muscle tone. Coordination normal.  Skin: Skin is warm and dry. He is not diaphoretic.  Psychiatric: He has a normal mood and affect. His behavior is normal. Judgment and thought content normal.   He can swing his left arm but cannot use fingers in hand to grip anything  He has chronic skin changes related to his infection. 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

## 2017-04-16 NOTE — Patient Instructions (Signed)
Jason SouthwardMinh Pham with [facility] pharmacy is trying to get you another pill that you can take to treat this infection  I would like you to come and see him on April 4th (day after you finish your IV antibiotics)

## 2017-04-21 ENCOUNTER — Encounter: Payer: Self-pay | Admitting: Infectious Disease

## 2017-04-23 ENCOUNTER — Other Ambulatory Visit: Payer: Self-pay | Admitting: Pharmacist

## 2017-04-24 ENCOUNTER — Encounter: Payer: Self-pay | Admitting: Infectious Disease

## 2017-04-25 ENCOUNTER — Other Ambulatory Visit: Payer: Self-pay | Admitting: Pharmacist

## 2017-04-28 ENCOUNTER — Encounter (HOSPITAL_COMMUNITY): Payer: Self-pay | Admitting: Family Medicine

## 2017-04-28 ENCOUNTER — Other Ambulatory Visit: Payer: Self-pay

## 2017-04-28 ENCOUNTER — Encounter (HOSPITAL_COMMUNITY): Payer: Self-pay | Admitting: Emergency Medicine

## 2017-04-28 ENCOUNTER — Ambulatory Visit (HOSPITAL_COMMUNITY)
Admission: EM | Admit: 2017-04-28 | Discharge: 2017-04-28 | Disposition: A | Discharge status: Home / Self Care | Payer: Self-pay | Attending: Family Medicine | Admitting: Family Medicine

## 2017-04-28 DIAGNOSIS — J111 Influenza due to unidentified influenza virus with other respiratory manifestations: Secondary | ICD-10-CM

## 2017-04-28 DIAGNOSIS — B961 Klebsiella pneumoniae [K. pneumoniae] as the cause of diseases classified elsewhere: Secondary | ICD-10-CM | POA: Diagnosis present

## 2017-04-28 DIAGNOSIS — Y848 Other medical procedures as the cause of abnormal reaction of the patient, or of later complication, without mention of misadventure at the time of the procedure: Secondary | ICD-10-CM | POA: Diagnosis present

## 2017-04-28 DIAGNOSIS — D509 Iron deficiency anemia, unspecified: Secondary | ICD-10-CM | POA: Diagnosis present

## 2017-04-28 DIAGNOSIS — Z79899 Other long term (current) drug therapy: Secondary | ICD-10-CM

## 2017-04-28 DIAGNOSIS — M868X2 Other osteomyelitis, upper arm: Secondary | ICD-10-CM | POA: Diagnosis present

## 2017-04-28 DIAGNOSIS — R7881 Bacteremia: Secondary | ICD-10-CM | POA: Diagnosis present

## 2017-04-28 DIAGNOSIS — N179 Acute kidney failure, unspecified: Secondary | ICD-10-CM | POA: Diagnosis present

## 2017-04-28 DIAGNOSIS — E876 Hypokalemia: Secondary | ICD-10-CM | POA: Diagnosis present

## 2017-04-28 DIAGNOSIS — T80211A Bloodstream infection due to central venous catheter, initial encounter: Principal | ICD-10-CM | POA: Diagnosis present

## 2017-04-28 DIAGNOSIS — D696 Thrombocytopenia, unspecified: Secondary | ICD-10-CM | POA: Diagnosis present

## 2017-04-28 DIAGNOSIS — M545 Low back pain: Secondary | ICD-10-CM | POA: Diagnosis present

## 2017-04-28 DIAGNOSIS — R69 Illness, unspecified: Secondary | ICD-10-CM

## 2017-04-28 DIAGNOSIS — Z87891 Personal history of nicotine dependence: Secondary | ICD-10-CM

## 2017-04-28 DIAGNOSIS — Z1623 Resistance to quinolones and fluoroquinolones: Secondary | ICD-10-CM | POA: Diagnosis present

## 2017-04-28 LAB — URINALYSIS, ROUTINE W REFLEX MICROSCOPIC
BACTERIA UA: NONE SEEN
Bilirubin Urine: NEGATIVE
GLUCOSE, UA: NEGATIVE mg/dL
Ketones, ur: NEGATIVE mg/dL
Leukocytes, UA: NEGATIVE
Nitrite: NEGATIVE
PH: 6 (ref 5.0–8.0)
Protein, ur: 100 mg/dL — AB
SPECIFIC GRAVITY, URINE: 1.021 (ref 1.005–1.030)
SQUAMOUS EPITHELIAL / LPF: NONE SEEN

## 2017-04-28 LAB — I-STAT CG4 LACTIC ACID, ED: Lactic Acid, Venous: 0.97 mmol/L (ref 0.5–1.9)

## 2017-04-28 MED ORDER — OSELTAMIVIR PHOSPHATE 75 MG PO CAPS: 75.0000 mg | ORAL_CAPSULE | Freq: Two times a day (BID) | ORAL | 0 refills | Status: DC

## 2017-04-28 MED ORDER — ACETAMINOPHEN 500 MG PO TABS: 500.0000 mg | ORAL_TABLET | Freq: Four times a day (QID) | ORAL | 0 refills | Status: DC | PRN

## 2017-04-28 MED ORDER — ACETAMINOPHEN 325 MG PO TABS
650.0000 mg | ORAL_TABLET | Freq: Once | ORAL | Status: AC
  Administered 2017-04-28: 650 mg via ORAL

## 2017-04-28 MED ORDER — ONDANSETRON HCL 4 MG PO TABS: 4.0000 mg | ORAL_TABLET | Freq: Three times a day (TID) | ORAL | 0 refills | Status: DC | PRN

## 2017-04-28 MED ORDER — ACETAMINOPHEN 325 MG PO TABS
ORAL_TABLET | ORAL | Status: AC
  Filled 2017-04-28: qty 2

## 2017-04-28 MED ORDER — ACETAMINOPHEN 325 MG PO TABS
650.0000 mg | ORAL_TABLET | Freq: Once | ORAL | Status: AC | PRN
  Administered 2017-04-28: 650 mg via ORAL
  Filled 2017-04-28: qty 2

## 2017-04-28 NOTE — Discharge Instructions (Signed)
Push fluids to ensure adequate hydration and keep secretions thin.  Tylenol and/or ibuprofen as needed for pain or fevers.  Complete course of tamiflu. If develop worsening of fevers, pain, dehydration or otherwise not improving in the next 48 hours please go to the ER for further evaluation.

## 2017-04-28 NOTE — ED Provider Notes (Signed)
[facility]    CSN: [id number] Arrival date & time: 04/28/17  1528     History   Chief Complaint Chief Complaint  Patient presents with  . Fever  . Back Pain  . Emesis    HPI Jason Maxwell is a 32 y.o. male.   Jason Maxwell presents with family with complaints of back pain, headache, fevers, 1 episode of vomiting, slight cough and congestion which started last night. Family provides interpretation per patient request. Denies any current nausea or abdominal pain. Has not had diarrhea. Urinating. No known ill contacts. No recent travel. Had surgery to left humerus 03/2017 and is on long term antibiotics for osteomyelitis. Denies any arm pain, no longer with any open sores or lesions. Has a PICC line to RUA. Has not taken any medications for his symptoms. without other contributing medical history.   ROS per HPI.      History reviewed. No pertinent past medical history.  Patient Active Problem List   Diagnosis Date Noted  . MRSA infection   . 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 Fracture   . Humerus fracture   . 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 arm surgery Left   . FRACTURE SURGERY    . HERNIA REPAIR Right    inguinal   . HUMERUS IM NAIL Left 03/12/2017   Procedure: INTRAMEDULLARY (IM) NAIL LEFT HUMERAL;  Surgeon: Roby Lofts, MD;  Location: [facility];  Service: Orthopedics;  Laterality: Left;  . IM NAILING HUMERUS Left 03/12/2017  . INCISION AND DRAINAGE OF WOUND Left 03/12/2017   Procedure: IRRIGATION AND DEBRIDEMENT LEFT HUMEROUS WOUND;  Surgeon: Roby Lofts, MD;  Location: [facility] OR;  Service: Orthopedics;  Laterality: Left;       Home Medications    Prior to Admission medications   Medication Sig Start Date End Date Taking? Authorizing Provider  acetaminophen (TYLENOL) 500 MG tablet Take 1 tablet (500 mg total) by mouth every 6  (six) hours as needed. 04/28/17   Linus Mako B, NP  ceftaroline 600 mg in sodium chloride 0.9 % 250 mL Inject 600 mg into the vein every 12 (twelve) hours. 04/09/17   Kuppelweiser, Cassie L, RPH-CPP  HYDROcodone-acetaminophen (NORCO/VICODIN) 5-325 MG tablet Take 1-2 tablets by mouth every 4 (four) hours as needed for severe pain. 03/14/17   Haddix, Gillie Manners, MD  levofloxacin (LEVAQUIN) 750 MG tablet Take 1 tablet (750 mg total) by mouth daily. 04/09/17   Randall Hiss, MD  ondansetron (ZOFRAN) 4 MG tablet Take 1 tablet (4 mg total) by mouth every 8 (eight) hours as needed for nausea or vomiting. 04/28/17   Georgetta Haber, NP  oseltamivir (TAMIFLU) 75 MG capsule Take 1 capsule (75 mg total) by mouth every 12 (twelve) hours for 5 days. 04/28/17 05/03/17  Linus Mako B, NP  rifampin (RIFADIN) 300 MG capsule Take 1 capsule (300 mg total) by mouth every 12 (twelve) hours. 03/14/17 05/08/17  Haddix, Gillie Manners, MD    Family History History reviewed. No pertinent family history.  Social History Social History   Tobacco Use  . Smoking status: Former Smoker    Last attempt to quit: 03/12/2015    Years since quitting: 2.1  . Smokeless tobacco: Never Used  Substance Use Topics  . Alcohol use: No  . Drug use: No     Allergies   Pork-derived products  Review of Systems Review of Systems   Physical Exam Triage Vital Signs ED Triage Vitals  Enc Vitals Group     BP 04/28/17 1612 119/72     Pulse Rate 04/28/17 1612 (!) 105     Resp 04/28/17 1612 18     Temp 04/28/17 1612 (!) 101.4 F (38.6 C)     Temp Source 04/28/17 1612 Oral     SpO2 04/28/17 1728 99 %     Weight --      Height --      Head Circumference --      Peak Flow --      Pain Score --      Pain Loc --      Pain Edu? --      Excl. in GC? --    No data found.  Updated Vital Signs BP 119/72   Pulse 88   Temp 99.4 F (37.4 C) (Oral)   Resp 18   SpO2 99%   Visual Acuity Right Eye Distance:   Left Eye Distance:     Bilateral Distance:    Right Eye Near:   Left Eye Near:    Bilateral Near:     Physical Exam  Constitutional: He is oriented to person, place, and time. He appears well-developed and well-nourished.  HENT:  Head: Normocephalic and atraumatic.  Right Ear: Tympanic membrane, external ear and ear canal normal.  Left Ear: Tympanic membrane, external ear and ear canal normal.  Nose: Nose normal. Right sinus exhibits no maxillary sinus tenderness and no frontal sinus tenderness. Left sinus exhibits no maxillary sinus tenderness and no frontal sinus tenderness.  Mouth/Throat: Uvula is midline, oropharynx is clear and moist and mucous membranes are normal.  Eyes: Pupils are equal, round, and reactive to light. Conjunctivae are normal.  Neck: Normal range of motion.  Cardiovascular: Normal rate and regular rhythm.  Pulmonary/Chest: Effort normal and breath sounds normal.  Abdominal: Soft. Bowel sounds are normal. There is generalized tenderness.  Musculoskeletal:  Without open or draining wounds to arms but with significant scarring; denies pain to left arm   Lymphadenopathy:    He has no cervical adenopathy.  Neurological: He is alert and oriented to person, place, and time.  Skin: Skin is warm and dry.  Vitals reviewed.    UC Treatments / Results  Labs (all labs ordered are listed, but only abnormal results are displayed) Labs Reviewed - No data to display  EKG None Radiology No results found.  Procedures Procedures (including critical care time)  Medications Ordered in UC Medications  acetaminophen (TYLENOL) tablet 650 mg (650 mg Oral Given 04/28/17 1614)     Initial Impression / Assessment and Plan / UC Course  I have reviewed the triage vital signs and the nursing notes.  Pertinent labs & imaging results that were available during my care of the patient were reviewed by me and considered in my medical decision making (see chart for details).     Hr and temp  improved with tylenol. Without vomiting since this morning. Is on antibiotics for osteomyelitis. Without diarrhea. Generalized abdominal pain, non specific exam. concerning for viral illness. Will cover with tamiflu at this time. Return precautions provided, emphasized infection risk due to PICC in place, sepsis risk. Patient and afmily verbalized understanding and agreeable to plan.    Final Clinical Impressions(s) / UC Diagnoses   Final diagnoses:  Influenza-like illness    ED Discharge Orders        Ordered  oseltamivir (TAMIFLU) 75 MG capsule  Every 12 hours     04/28/17 1744    acetaminophen (TYLENOL) 500 MG tablet  Every 6 hours PRN     04/28/17 1744    ondansetron (ZOFRAN) 4 MG tablet  Every 8 hours PRN     04/28/17 1744       Controlled Substance Prescriptions [place] Controlled Substance Registry consulted? Not Applicable   Georgetta HaberBurky, Jakerria Kingbird B, NP 04/28/17 1756

## 2017-04-28 NOTE — ED Triage Notes (Signed)
Pt here for back pain, fever and vomiting since last night.

## 2017-04-28 NOTE — ED Triage Notes (Addendum)
Pt reports fever and headache for three days.  Pt reports generalized weakness for three days.  Pt family reports that as soon as his IV antibiotics Teflaro is completed (twice daily) pt begins to have the chills.

## 2017-04-29 ENCOUNTER — Inpatient Hospital Stay (HOSPITAL_COMMUNITY)
Admission: EM | Admit: 2017-04-29 | Discharge: 2017-05-02 | DRG: 315 | Disposition: A | Discharge status: Home / Self Care | Payer: Self-pay | Attending: Family Medicine | Admitting: Family Medicine

## 2017-04-29 ENCOUNTER — Inpatient Hospital Stay (HOSPITAL_COMMUNITY): Payer: Self-pay

## 2017-04-29 ENCOUNTER — Emergency Department (HOSPITAL_COMMUNITY): Payer: Self-pay

## 2017-04-29 ENCOUNTER — Other Ambulatory Visit: Payer: Self-pay

## 2017-04-29 DIAGNOSIS — T148XXA Other injury of unspecified body region, initial encounter: Secondary | ICD-10-CM

## 2017-04-29 DIAGNOSIS — M869 Osteomyelitis, unspecified: Secondary | ICD-10-CM | POA: Diagnosis present

## 2017-04-29 DIAGNOSIS — M86222 Subacute osteomyelitis, left humerus: Secondary | ICD-10-CM

## 2017-04-29 DIAGNOSIS — Z8781 Personal history of (healed) traumatic fracture: Secondary | ICD-10-CM

## 2017-04-29 DIAGNOSIS — D509 Iron deficiency anemia, unspecified: Secondary | ICD-10-CM | POA: Diagnosis present

## 2017-04-29 DIAGNOSIS — Z87891 Personal history of nicotine dependence: Secondary | ICD-10-CM

## 2017-04-29 DIAGNOSIS — R509 Fever, unspecified: Secondary | ICD-10-CM

## 2017-04-29 DIAGNOSIS — N179 Acute kidney failure, unspecified: Secondary | ICD-10-CM

## 2017-04-29 DIAGNOSIS — M86622 Other chronic osteomyelitis, left  humerus: Secondary | ICD-10-CM

## 2017-04-29 DIAGNOSIS — R61 Generalized hyperhidrosis: Secondary | ICD-10-CM

## 2017-04-29 DIAGNOSIS — M549 Dorsalgia, unspecified: Secondary | ICD-10-CM

## 2017-04-29 DIAGNOSIS — T80219A Unspecified infection due to central venous catheter, initial encounter: Secondary | ICD-10-CM | POA: Diagnosis present

## 2017-04-29 DIAGNOSIS — R7881 Bacteremia: Secondary | ICD-10-CM | POA: Diagnosis present

## 2017-04-29 DIAGNOSIS — R5381 Other malaise: Secondary | ICD-10-CM

## 2017-04-29 DIAGNOSIS — R51 Headache: Secondary | ICD-10-CM

## 2017-04-29 DIAGNOSIS — R630 Anorexia: Secondary | ICD-10-CM

## 2017-04-29 DIAGNOSIS — R5383 Other fatigue: Secondary | ICD-10-CM

## 2017-04-29 DIAGNOSIS — Z95828 Presence of other vascular implants and grafts: Secondary | ICD-10-CM

## 2017-04-29 LAB — CBC WITH DIFFERENTIAL/PLATELET
BASOS ABS: 0 10*3/uL (ref 0.0–0.1)
BASOS PCT: 0 %
Basophils Absolute: 0 10*3/uL (ref 0.0–0.1)
Basophils Relative: 0 %
EOS ABS: 0 10*3/uL (ref 0.0–0.7)
EOS PCT: 0 %
Eosinophils Absolute: 0 10*3/uL (ref 0.0–0.7)
Eosinophils Relative: 0 %
HCT: 30.3 % — ABNORMAL LOW (ref 39.0–52.0)
HEMATOCRIT: 31.9 % — AB (ref 39.0–52.0)
HEMOGLOBIN: 10.5 g/dL — AB (ref 13.0–17.0)
Hemoglobin: 10.9 g/dL — ABNORMAL LOW (ref 13.0–17.0)
LYMPHS ABS: 0.7 10*3/uL (ref 0.7–4.0)
LYMPHS PCT: 27 %
Lymphocytes Relative: 12 %
Lymphs Abs: 1.1 10*3/uL (ref 0.7–4.0)
MCH: 22 pg — ABNORMAL LOW (ref 26.0–34.0)
MCH: 22.7 pg — ABNORMAL LOW (ref 26.0–34.0)
MCHC: 34.2 g/dL (ref 30.0–36.0)
MCHC: 34.7 g/dL (ref 30.0–36.0)
MCV: 64.4 fL — AB (ref 78.0–100.0)
MCV: 65.4 fL — ABNORMAL LOW (ref 78.0–100.0)
MONO ABS: 0.7 10*3/uL (ref 0.1–1.0)
Monocytes Absolute: 0.8 10*3/uL (ref 0.1–1.0)
Monocytes Relative: 13 %
Monocytes Relative: 19 %
NEUTROS PCT: 75 %
NEUTROS PCT: 54 %
Neutro Abs: 4.1 10*3/uL (ref 1.7–7.7)
Neutro Abs: 2.2 10*3/uL (ref 1.7–7.7)
PLATELETS: 107 10*3/uL — AB (ref 150–400)
PLATELETS: 109 10*3/uL — AB (ref 150–400)
RBC: 4.95 MIL/uL (ref 4.22–5.81)
RBC: 4.63 MIL/uL (ref 4.22–5.81)
RDW: 16.4 % — AB (ref 11.5–15.5)
RDW: 17 % — ABNORMAL HIGH (ref 11.5–15.5)
WBC: 5.5 10*3/uL (ref 4.0–10.5)
WBC: 4.1 10*3/uL (ref 4.0–10.5)

## 2017-04-29 LAB — BLOOD CULTURE ID PANEL (REFLEXED)
Acinetobacter baumannii: NOT DETECTED
CANDIDA GLABRATA: NOT DETECTED
CANDIDA KRUSEI: NOT DETECTED
CANDIDA PARAPSILOSIS: NOT DETECTED
Candida albicans: NOT DETECTED
Candida tropicalis: NOT DETECTED
Carbapenem resistance: NOT DETECTED
ENTEROCOCCUS SPECIES: NOT DETECTED
ESCHERICHIA COLI: NOT DETECTED
Enterobacter cloacae complex: NOT DETECTED
Enterobacteriaceae species: DETECTED — AB
Haemophilus influenzae: NOT DETECTED
KLEBSIELLA OXYTOCA: NOT DETECTED
KLEBSIELLA PNEUMONIAE: DETECTED — AB
LISTERIA MONOCYTOGENES: NOT DETECTED
Neisseria meningitidis: NOT DETECTED
Proteus species: NOT DETECTED
Pseudomonas aeruginosa: NOT DETECTED
SERRATIA MARCESCENS: NOT DETECTED
STREPTOCOCCUS PNEUMONIAE: NOT DETECTED
STREPTOCOCCUS PYOGENES: NOT DETECTED
Staphylococcus aureus (BCID): NOT DETECTED
Staphylococcus species: NOT DETECTED
Streptococcus agalactiae: NOT DETECTED
Streptococcus species: NOT DETECTED

## 2017-04-29 LAB — URINALYSIS, ROUTINE W REFLEX MICROSCOPIC
Bilirubin Urine: NEGATIVE
GLUCOSE, UA: NEGATIVE mg/dL
HGB URINE DIPSTICK: NEGATIVE
Ketones, ur: 5 mg/dL — AB
Leukocytes, UA: NEGATIVE
Nitrite: NEGATIVE
PH: 7 (ref 5.0–8.0)
PROTEIN: NEGATIVE mg/dL
Specific Gravity, Urine: 1.009 (ref 1.005–1.030)

## 2017-04-29 LAB — COMPREHENSIVE METABOLIC PANEL
ALK PHOS: 98 U/L (ref 38–126)
ALT: 60 U/L (ref 17–63)
ALT: 56 U/L (ref 17–63)
ANION GAP: 9 (ref 5–15)
AST: 85 U/L — AB (ref 15–41)
AST: 72 U/L — ABNORMAL HIGH (ref 15–41)
Albumin: 3.4 g/dL — ABNORMAL LOW (ref 3.5–5.0)
Albumin: 3.2 g/dL — ABNORMAL LOW (ref 3.5–5.0)
Alkaline Phosphatase: 101 U/L (ref 38–126)
Anion gap: 8 (ref 5–15)
BILIRUBIN TOTAL: 1.5 mg/dL — AB (ref 0.3–1.2)
BILIRUBIN TOTAL: 1.2 mg/dL (ref 0.3–1.2)
BUN: 10 mg/dL (ref 6–20)
BUN: 9 mg/dL (ref 6–20)
CALCIUM: 8.5 mg/dL — AB (ref 8.9–10.3)
CO2: 23 mmol/L (ref 22–32)
CO2: 24 mmol/L (ref 22–32)
Calcium: 8.3 mg/dL — ABNORMAL LOW (ref 8.9–10.3)
Chloride: 102 mmol/L (ref 101–111)
Chloride: 104 mmol/L (ref 101–111)
Creatinine, Ser: 1.48 mg/dL — ABNORMAL HIGH (ref 0.61–1.24)
Creatinine, Ser: 1.08 mg/dL (ref 0.61–1.24)
GFR calc Af Amer: >60 mL/min (ref >60)
GLUCOSE: 148 mg/dL — AB (ref 65–99)
Glucose, Bld: 108 mg/dL — ABNORMAL HIGH (ref 65–99)
POTASSIUM: 3.2 mmol/L — AB (ref 3.5–5.1)
Potassium: 3.2 mmol/L — ABNORMAL LOW (ref 3.5–5.1)
Sodium: 133 mmol/L — ABNORMAL LOW (ref 135–145)
Sodium: 137 mmol/L (ref 135–145)
TOTAL PROTEIN: 5.9 g/dL — AB (ref 6.5–8.1)
Total Protein: 6.3 g/dL — ABNORMAL LOW (ref 6.5–8.1)

## 2017-04-29 LAB — INFLUENZA PANEL BY PCR (TYPE A & B)
Influenza A By PCR: NEGATIVE
Influenza B By PCR: NEGATIVE

## 2017-04-29 LAB — I-STAT CG4 LACTIC ACID, ED: LACTIC ACID, VENOUS: 0.75 mmol/L (ref 0.5–1.9)

## 2017-04-29 LAB — POC OCCULT BLOOD, ED: Fecal Occult Bld: NEGATIVE

## 2017-04-29 MED ORDER — ACETAMINOPHEN 325 MG PO TABS
650.0000 mg | ORAL_TABLET | Freq: Four times a day (QID) | ORAL | Status: DC | PRN
  Administered 2017-04-30: 650 mg via ORAL
  Filled 2017-04-29: qty 2

## 2017-04-29 MED ORDER — SODIUM CHLORIDE 0.9 % IV BOLUS
1000.0000 mL | Freq: Once | INTRAVENOUS | Status: AC
  Administered 2017-04-29: 1000 mL via INTRAVENOUS

## 2017-04-29 MED ORDER — POLYETHYLENE GLYCOL 3350 17 G PO PACK: 17.0000 g | PACK | Freq: Every day | ORAL | Status: DC | PRN

## 2017-04-29 MED ORDER — LEVOFLOXACIN 750 MG PO TABS
750.0000 mg | ORAL_TABLET | Freq: Every day | ORAL | Status: DC
  Administered 2017-04-29 – 2017-05-01 (×3): 750 mg via ORAL
  Filled 2017-04-29 (×3): qty 1

## 2017-04-29 MED ORDER — SODIUM CHLORIDE 0.9 % IV BOLUS (SEPSIS)
1000.0000 mL | Freq: Once | INTRAVENOUS | Status: AC
Start: 2017-04-29 — End: 2017-04-29
  Administered 2017-04-29: 1000 mL via INTRAVENOUS

## 2017-04-29 MED ORDER — RIFAMPIN 300 MG PO CAPS
300.0000 mg | ORAL_CAPSULE | Freq: Two times a day (BID) | ORAL | Status: DC
  Administered 2017-04-29 – 2017-05-02 (×7): 300 mg via ORAL
  Filled 2017-04-29 (×9): qty 1

## 2017-04-29 MED ORDER — ENOXAPARIN SODIUM 40 MG/0.4ML ~~LOC~~ SOLN
40.0000 mg | SUBCUTANEOUS | Status: DC
  Administered 2017-04-29 – 2017-05-01 (×3): 40 mg via SUBCUTANEOUS
  Filled 2017-04-29 (×3): qty 0.4

## 2017-04-29 MED ORDER — SODIUM CHLORIDE 0.9 % IV BOLUS (SEPSIS)
1000.0000 mL | Freq: Once | INTRAVENOUS | Status: AC
  Administered 2017-04-29: 1000 mL via INTRAVENOUS

## 2017-04-29 MED ORDER — SODIUM CHLORIDE 0.9 % IV SOLN
600.0000 mg | Freq: Two times a day (BID) | INTRAVENOUS | Status: DC
  Administered 2017-04-29 – 2017-05-01 (×5): 600 mg via INTRAVENOUS
  Filled 2017-04-29 (×5): qty 600

## 2017-04-29 MED ORDER — ACETAMINOPHEN 650 MG RE SUPP: 650.0000 mg | Freq: Four times a day (QID) | RECTAL | Status: DC | PRN
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
## 2017-04-29 NOTE — ED Notes (Signed)
Called for Pt to recheck vitals. No answer x's 4 
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
## 2017-04-29 NOTE — ED Notes (Addendum)
Pt was given tylenol at urgent care- took one more dose at home, then was given tylenol on arrival to ed last night.

## 2017-04-29 NOTE — ED Provider Notes (Addendum)
[facility]MENT Provider Note   CSN: [id number] Arrival date & time: 04/28/17  2217     History   Chief Complaint Chief Complaint  Patient presents with  . Fever  . Weakness    HPI [facility] is a 32 y.o. male.  HPI  Level 5 caveat History obtained through interpreter 32 year old man recently emigrated from [place] presents today with a fever.  He reports some congestion with mild cough.  He was seen yesterday at urgent care and clinically diagnosed with influenza and started on Tamiflu he has had a history of osteomyelitis and is currently on multiple antibiotics.  He reports one episode of vomiting yesterday and some low back pain radiating to the right buttocks.Patient had humerus fracture with plating and removal of hardware prior to moving to [place]. He had had infected nonunion of humerus and was operated on by Dr. Jena Gauss 2/6 for osteomyelitis of his humerus and had insertion of antibiotic cement spacer and intramedullary nailing of his left humerus.  He has been on multiple antibiotics and has a picc line in place History reviewed. No pertinent past medical history.  Patient Active Problem List   Diagnosis Date Noted  . MRSA infection   . 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 Fracture   . Humerus fracture   . 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 arm surgery Left   . FRACTURE SURGERY    . HERNIA REPAIR Right    inguinal   . HUMERUS IM NAIL Left 03/12/2017   Procedure: INTRAMEDULLARY (IM) NAIL LEFT HUMERAL;  Surgeon: Roby Lofts, MD;  Location: [facility];  Service: Orthopedics;  Laterality: Left;  . IM NAILING HUMERUS Left 03/12/2017  . INCISION AND DRAINAGE OF WOUND Left 03/12/2017   Procedure: IRRIGATION AND DEBRIDEMENT LEFT HUMEROUS WOUND;  Surgeon: Roby Lofts, MD;  Location: [facility] OR;  Service: Orthopedics;   Laterality: Left;        Home Medications    Prior to Admission medications   Medication Sig Start Date End Date Taking? Authorizing Provider  acetaminophen (TYLENOL) 500 MG tablet Take 1 tablet (500 mg total) by mouth every 6 (six) hours as needed. 04/28/17   Linus Mako B, NP  ceftaroline 600 mg in sodium chloride 0.9 % 250 mL Inject 600 mg into the vein every 12 (twelve) hours. 04/09/17   Kuppelweiser, Cassie L, RPH-CPP  HYDROcodone-acetaminophen (NORCO/VICODIN) 5-325 MG tablet Take 1-2 tablets by mouth every 4 (four) hours as needed for severe pain. 03/14/17   Haddix, Gillie Manners, MD  levofloxacin (LEVAQUIN) 750 MG tablet Take 1 tablet (750 mg total) by mouth daily. 04/09/17   Randall Hiss, MD  ondansetron (ZOFRAN) 4 MG tablet Take 1 tablet (4 mg total) by mouth every 8 (eight) hours as needed for nausea or vomiting. 04/28/17   Georgetta Haber, NP  oseltamivir (TAMIFLU) 75 MG capsule Take 1 capsule (75 mg total) by mouth every 12 (twelve) hours for 5 days. 04/28/17 05/03/17  Linus Mako B, NP  rifampin (RIFADIN) 300 MG capsule Take 1 capsule (300 mg total) by mouth every 12 (twelve) hours. 03/14/17 05/08/17  Haddix, Gillie Manners, MD    Family History No family history on file.  Social History Social History   Tobacco Use  . Smoking status: Former Smoker    Last attempt to quit: 03/12/2015  Years since quitting: 2.1  . Smokeless tobacco: Never Used  Substance Use Topics  . Alcohol use: No  . Drug use: No     Allergies   Pork-derived products   Review of Systems Review of Systems  Constitutional: Positive for appetite change, chills and fever.  HENT: Positive for congestion.   Eyes: Negative.   Respiratory: Positive for cough.   Cardiovascular: Negative.   Gastrointestinal: Positive for vomiting.  Endocrine: Negative.   Genitourinary: Negative.   Musculoskeletal: Positive for back pain.  All other systems reviewed and are negative.    Physical Exam Updated Vital  Signs BP 120/68   Pulse 75   Temp 98.2 F (36.8 C) (Oral)   Resp (!) 21   Ht 1.803 m (5\' 11" )   Wt 85.7 kg (189 lb)   SpO2 100%   BMI 26.36 kg/m   Physical Exam  Constitutional: He appears well-developed and well-nourished.  HENT:  Head: Normocephalic and atraumatic.  Right Ear: External ear normal.  Left Ear: External ear normal.  Mouth/Throat: Oropharynx is clear and moist.  Eyes: Pupils are equal, round, and reactive to light. EOM are normal.  Neck: Normal range of motion. Neck supple.  Cardiovascular: Normal rate, regular rhythm and normal heart sounds.  Pulmonary/Chest: Effort normal and breath sounds normal.  Abdominal: Soft. Bowel sounds are normal.  Musculoskeletal:  Left arm with chronic skin changes and some erythema which appears chronic- no open wounds, no ttp Rue with picc line in place  Neurological: He is alert.  Skin: Skin is warm. Capillary refill takes less than 2 seconds.  Psychiatric: He has a normal mood and affect.  Nursing note and vitals reviewed.    ED Treatments / Results  Labs (all labs ordered are listed, but only abnormal results are displayed) Labs Reviewed  COMPREHENSIVE METABOLIC PANEL - Abnormal; Notable for the following components:      Result Value   Sodium 133 (*)    Potassium 3.2 (*)    Glucose, Bld 148 (*)    Creatinine, Ser 1.48 (*)    Calcium 8.3 (*)    Total Protein 6.3 (*)    Albumin 3.4 (*)    AST 85 (*)    Total Bilirubin 1.5 (*)    All other components within normal limits  CBC WITH DIFFERENTIAL/PLATELET - Abnormal; Notable for the following components:   Hemoglobin 10.9 (*)    HCT 31.9 (*)    MCV 64.4 (*)    MCH 22.0 (*)    RDW 16.4 (*)    Platelets 107 (*)    All other components within normal limits  URINALYSIS, ROUTINE W REFLEX MICROSCOPIC - Abnormal; Notable for the following components:   Color, Urine AMBER (*)    Hgb urine dipstick MODERATE (*)    Protein, ur 100 (*)    All other components within  normal limits  COMPREHENSIVE METABOLIC PANEL - Abnormal; Notable for the following components:   Potassium 3.2 (*)    Glucose, Bld 108 (*)    Calcium 8.5 (*)    Total Protein 5.9 (*)    Albumin 3.2 (*)    AST 72 (*)    All other components within normal limits  CBC WITH DIFFERENTIAL/PLATELET - Abnormal; Notable for the following components:   Hemoglobin 10.5 (*)    HCT 30.3 (*)    MCV 65.4 (*)    MCH 22.7 (*)    RDW 17.0 (*)    Platelets 109 (*)  All other components within normal limits  CULTURE, BLOOD (ROUTINE X 2)  CULTURE, BLOOD (ROUTINE X 2)  INFLUENZA PANEL BY PCR (TYPE A & B)  URINALYSIS, ROUTINE W REFLEX MICROSCOPIC  OCCULT BLOOD X 1 CARD TO LAB, STOOL  I-STAT CG4 LACTIC ACID, ED  I-STAT CG4 LACTIC ACID, ED  I-STAT CG4 LACTIC ACID, ED  I-STAT CG4 LACTIC ACID, ED    EKG None  Radiology Dg Chest Port 1 View  Result Date: 04/29/2017 CLINICAL DATA:  Cough and fever. EXAM: PORTABLE CHEST 1 VIEW COMPARISON:  None. FINDINGS: Right upper extremity PICC with tip at the upper cavoatrial junction/upper right atrium. There is no edema, consolidation, effusion, or pneumothorax. Normal heart size and mediastinal contours. IMPRESSION: No evidence of active disease. Electronically Signed   By: Marnee SpringJonathon  Watts M.D.   On: 04/29/2017 09:03    Procedures Procedures (including critical care time)  Medications Ordered in ED Medications  sodium chloride 0.9 % bolus 1,000 mL (0 mLs Intravenous Stopped 04/29/17 1013)    And  sodium chloride 0.9 % bolus 1,000 mL (0 mLs Intravenous Stopped 04/29/17 1114)    And  sodium chloride 0.9 % bolus 1,000 mL (1,000 mLs Intravenous New Bag/Given 04/29/17 1113)  acetaminophen (TYLENOL) tablet 650 mg (650 mg Oral Given 04/28/17 2347)     Initial Impression / Assessment and Plan / ED Course  I have reviewed the triage vital signs and the nursing notes.  Pertinent labs & imaging results that were available during my care of the patient were  reviewed by me and considered in my medical decision making (see chart for details).     32 year old male history of osteomyelitis presents today with fever or shaking chills.  He was clinically diagnosed with flu yesterday at urgent care and started on Tamiflu.  Here today his flu test is negative.  Labs reveal slightly decreased potassium at 3.2.  White blood cell count is normal.  He is mildly anemic with hemoglobin decreased from 12 on 3 4-10.5 today.He initially had temp to 102.5 here.  He has no definite source.  I discussed the patient's care with Dr. Orvan Falconerampbell, on-call for infectious disease.  He advises continuing his current antibiotics and admission for further evaluation of the source.  I have attempted to page Dr. Jena GaussHaddix and awaiting callback Patient seen by ortho and they do not think osteo is source.  Discussed with Dr. Darin EngelsAbraham and FP will  Final Clinical Impressions(s) / ED Diagnoses   Final diagnoses:  None    ED Discharge Orders    None       Margarita Grizzleay, Maddalyn Lutze, MD 04/29/17 1650    Margarita Grizzleay, Sareena Odeh, MD 04/29/17 [phone]

## 2017-04-29 NOTE — Progress Notes (Signed)
PHARMACY - PHYSICIAN COMMUNICATION CRITICAL VALUE ALERT - BLOOD CULTURE IDENTIFICATION (BCID)  [facility] is an 32 y.o. male who [facility]2017 with a chief complaint of fever.  Assessment:   32 yo M presents with back pain, fever, and vomiting. Known patient with ID outpatient for left humeral osteo after traumatic injury. No real positive cultures but did have a sinus tract drainage culture positive for MRSA in February. Has a PICC and was switched this month to ceftaroline, Levaquin, and rifampin. Abx continued inpatient. Ortho and ID consulted. Tmax of 102.4, WBC wnl.  Blood cx from PICC line growing Kleb Pneumo. Ceftaroline should cover as long as it not a MDR Kleb Pneumo. PICC line pulled earlier today. Would not recommend de-escalating as we also need to continue treatment of osteomyelitis.  Name of physician (or Provider) Contacted: FMTS  Current antibiotics: Ceftaroline, Levaquin, Rifampin  Changes to prescribed antibiotics recommended:  Patient is on recommended antibiotics - No changes needed  Results for orders placed or performed during the hospital encounter of 04/29/17  Blood Culture ID Panel (Reflexed) (Collected: 04/29/2017  9:05 AM)  Result Value Ref Range   Enterococcus species NOT DETECTED NOT DETECTED   Listeria monocytogenes NOT DETECTED NOT DETECTED   Staphylococcus species NOT DETECTED NOT DETECTED   Staphylococcus aureus NOT DETECTED NOT DETECTED   Streptococcus species NOT DETECTED NOT DETECTED   Streptococcus agalactiae NOT DETECTED NOT DETECTED   Streptococcus pneumoniae NOT DETECTED NOT DETECTED   Streptococcus pyogenes NOT DETECTED NOT DETECTED   Acinetobacter baumannii NOT DETECTED NOT DETECTED   Enterobacteriaceae species DETECTED (A) NOT DETECTED   Enterobacter cloacae complex NOT DETECTED NOT DETECTED   Escherichia coli NOT DETECTED NOT DETECTED   Klebsiella oxytoca NOT DETECTED NOT DETECTED   Klebsiella pneumoniae DETECTED  (A) NOT DETECTED   Proteus species NOT DETECTED NOT DETECTED   Serratia marcescens NOT DETECTED NOT DETECTED   Carbapenem resistance NOT DETECTED NOT DETECTED   Haemophilus influenzae NOT DETECTED NOT DETECTED   Neisseria meningitidis NOT DETECTED NOT DETECTED   Pseudomonas aeruginosa NOT DETECTED NOT DETECTED   Candida albicans NOT DETECTED NOT DETECTED   Candida glabrata NOT DETECTED NOT DETECTED   Candida krusei NOT DETECTED NOT DETECTED   Candida parapsilosis NOT DETECTED NOT DETECTED   Candida tropicalis NOT DETECTED NOT DETECTED    Armandina StammerBATCHELDER,Shaila Gilchrest J 04/29/2017  8:42 PM

## 2017-04-29 NOTE — Consult Note (Signed)
Reason for Consult:Fevers Referring Physician: D Ray  Jonothan Heberle is an 32 y.o. male.  HPI: Mr. Jason [facility] underwent treatment for left humeral osteo and non-union in early February. He has been doing well from that standpoint until 2 or 3 days ago when he began to have fevers and rigors along with profound fatigue and headaches. He went to [facility] and was clinically diagnosed with the flu and given Tamiflu. He continued to feel bad and sought care in the ED today. We were asked to see him as he recently had surgery. He specifically denies any changes in his arm including pain or discharge or sensory alteration. He does have a PICC for long-term abx treatment.  History reviewed. No pertinent past medical history.  Past Surgical History:  Procedure Laterality Date  . arm surgery Left   . FRACTURE SURGERY    . HERNIA REPAIR Right    inguinal   . HUMERUS IM NAIL Left 03/12/2017   Procedure: INTRAMEDULLARY (IM) NAIL LEFT HUMERAL;  Surgeon: Shona Needles, MD;  Location: [place];  Service: Orthopedics;  Laterality: Left;  . IM NAILING HUMERUS Left 03/12/2017  . INCISION AND DRAINAGE OF WOUND Left 03/12/2017   Procedure: IRRIGATION AND DEBRIDEMENT LEFT HUMEROUS WOUND;  Surgeon: Shona Needles, MD;  Location: [place];  Service: Orthopedics;  Laterality: Left;    No family history on file.  Social History:  reports that he quit smoking about 2 years ago. He has never used smokeless tobacco. He reports that he does not drink alcohol or use drugs.  Allergies:  Allergies  Allergen Reactions  . Pork-Derived Products     Religious reasons     Medications: I have reviewed the patient's current medications.  Results for orders placed or performed during the hospital encounter of 04/29/17 (from the past 48 hour(s))  Urinalysis, Routine w reflex microscopic     Status: Abnormal   Collection Time: 04/28/17 11:20 PM  Result Value Ref Range   Color, Urine AMBER (A) YELLOW    Comment: BIOCHEMICALS MAY BE  AFFECTED BY COLOR   APPearance CLEAR CLEAR   Specific Gravity, Urine 1.021 1.005 - 1.030   pH 6.0 5.0 - 8.0   Glucose, UA NEGATIVE NEGATIVE mg/dL   Hgb urine dipstick MODERATE (A) NEGATIVE   Bilirubin Urine NEGATIVE NEGATIVE   Ketones, ur NEGATIVE NEGATIVE mg/dL   Protein, ur 100 (A) NEGATIVE mg/dL   Nitrite NEGATIVE NEGATIVE   Leukocytes, UA NEGATIVE NEGATIVE   RBC / HPF 0-5 0 - 5 RBC/hpf   WBC, UA 0-5 0 - 5 WBC/hpf   Bacteria, UA NONE SEEN NONE SEEN   Squamous Epithelial / LPF NONE SEEN NONE SEEN    Comment: Performed at [facility], [place] [address], [place], [place] [address]  Comprehensive metabolic panel     Status: Abnormal   Collection Time: 04/28/17 11:21 PM  Result Value Ref Range   Sodium 133 (L) 135 - 145 mmol/L   Potassium 3.2 (L) 3.5 - 5.1 mmol/L   Chloride 102 101 - 111 mmol/L   CO2 23 22 - 32 mmol/L   Glucose, Bld 148 (H) 65 - 99 mg/dL   BUN 10 6 - 20 mg/dL   Creatinine, Ser 1.48 (H) 0.61 - 1.24 mg/dL   Calcium 8.3 (L) 8.9 - 10.3 mg/dL   Total Protein 6.3 (L) 6.5 - 8.1 g/dL   Albumin 3.4 (L) 3.5 - 5.0 g/dL   AST 85 (H) 15 - 41 U/L  ALT 60 17 - 63 U/L   Alkaline Phosphatase 101 38 - 126 U/L   Total Bilirubin 1.5 (H) 0.3 - 1.2 mg/dL   GFR calc non Af Amer >60 >60 mL/min   GFR calc Af Amer >60 >60 mL/min    Comment: (NOTE) The eGFR has been calculated using the CKD EPI equation. This calculation has not been validated in all clinical situations. eGFR's persistently <60 mL/min signify possible Chronic Kidney Disease.    Anion gap 8 5 - 15    Comment: Performed at [facility] [address] [place], [place] [address]  CBC with Differential     Status: Abnormal   Collection Time: 04/28/17 11:21 PM  Result Value Ref Range   WBC 5.5 4.0 - 10.5 K/uL   RBC 4.95 4.22 - 5.81 MIL/uL   Hemoglobin 10.9 (L) 13.0 - 17.0 g/dL   HCT 31.9 (L) 39.0 - 52.0 %   MCV 64.4 (L) 78.0 - 100.0 fL   MCH 22.0 (L) 26.0 - 34.0 pg   MCHC 34.2 30.0 - 36.0 g/dL   RDW  16.4 (H) 11.5 - 15.5 %   Platelets 107 (L) 150 - 400 K/uL    Comment: REPEATED TO VERIFY PLATELET COUNT CONFIRMED BY SMEAR    Neutrophils Relative % 75 %   Lymphocytes Relative 12 %   Monocytes Relative 13 %   Eosinophils Relative 0 %   Basophils Relative 0 %   Neutro Abs 4.1 1.7 - 7.7 K/uL   Lymphs Abs 0.7 0.7 - 4.0 K/uL   Monocytes Absolute 0.7 0.1 - 1.0 K/uL   Eosinophils Absolute 0.0 0.0 - 0.7 K/uL   Basophils Absolute 0.0 0.0 - 0.1 K/uL   RBC Morphology TARGET CELLS     Comment: Performed at [facility], [place] [address] [place], [place] [address]  I-Stat CG4 Lactic Acid, ED     Status: None   Collection Time: 04/28/17 11:22 PM  Result Value Ref Range   Lactic Acid, Venous 0.97 0.5 - 1.9 mmol/L  Comprehensive metabolic panel     Status: Abnormal   Collection Time: 04/29/17  8:30 AM  Result Value Ref Range   Sodium 137 135 - 145 mmol/L   Potassium 3.2 (L) 3.5 - 5.1 mmol/L   Chloride 104 101 - 111 mmol/L   CO2 24 22 - 32 mmol/L   Glucose, Bld 108 (H) 65 - 99 mg/dL   BUN 9 6 - 20 mg/dL   Creatinine, Ser 1.08 0.61 - 1.24 mg/dL   Calcium 8.5 (L) 8.9 - 10.3 mg/dL   Total Protein 5.9 (L) 6.5 - 8.1 g/dL   Albumin 3.2 (L) 3.5 - 5.0 g/dL   AST 72 (H) 15 - 41 U/L   ALT 56 17 - 63 U/L   Alkaline Phosphatase 98 38 - 126 U/L   Total Bilirubin 1.2 0.3 - 1.2 mg/dL   GFR calc non Af Amer >60 >60 mL/min   GFR calc Af Amer >60 >60 mL/min    Comment: (NOTE) The eGFR has been calculated using the CKD EPI equation. This calculation has not been validated in all clinical situations. eGFR's persistently <60 mL/min signify possible Chronic Kidney Disease.    Anion gap 9 5 - 15    Comment: Performed at [facility] [address] [place], [place] [address]  CBC WITH DIFFERENTIAL     Status: Abnormal   Collection Time: 04/29/17  8:30 AM  Result Value Ref Range  WBC 4.1 4.0 - 10.5 K/uL   RBC 4.63 4.22 - 5.81 MIL/uL   Hemoglobin 10.5 (L) 13.0 - 17.0 g/dL    Comment:  REPEATED TO VERIFY   HCT 30.3 (L) 39.0 - 52.0 %   MCV 65.4 (L) 78.0 - 100.0 fL   MCH 22.7 (L) 26.0 - 34.0 pg   MCHC 34.7 30.0 - 36.0 g/dL   RDW 17.0 (H) 11.5 - 15.5 %   Platelets 109 (L) 150 - 400 K/uL    Comment: PLATELET COUNT CONFIRMED BY SMEAR   Neutrophils Relative % 54 %   Lymphocytes Relative 27 %   Monocytes Relative 19 %   Eosinophils Relative 0 %   Basophils Relative 0 %   Neutro Abs 2.2 1.7 - 7.7 K/uL   Lymphs Abs 1.1 0.7 - 4.0 K/uL   Monocytes Absolute 0.8 0.1 - 1.0 K/uL   Eosinophils Absolute 0.0 0.0 - 0.7 K/uL   Basophils Absolute 0.0 0.0 - 0.1 K/uL   RBC Morphology TARGET CELLS     Comment: Performed at [facility], [address], [place], [place] [address]  Influenza panel by PCR (type A & B)     Status: None   Collection Time: 04/29/17  8:31 AM  Result Value Ref Range   Influenza A By PCR NEGATIVE NEGATIVE   Influenza B By PCR NEGATIVE NEGATIVE    Comment: (NOTE) The Xpert Xpress Flu assay is intended as an aid in the diagnosis of  influenza and should not be used as a sole basis for treatment.  This  assay is FDA approved for nasopharyngeal swab specimens only. Nasal  washings and aspirates are unacceptable for Xpert Xpress Flu testing. Performed at [facility], [place] [address], [place], [place] [address]   I-Stat CG4 Lactic Acid, ED     Status: None   Collection Time: 04/29/17  9:21 AM  Result Value Ref Range   Lactic Acid, Venous 0.75 0.5 - 1.9 mmol/L    Dg Chest Port 1 View  Result Date: 04/29/2017 CLINICAL DATA:  Cough and fever. EXAM: PORTABLE CHEST 1 VIEW COMPARISON:  None. FINDINGS: Right upper extremity PICC with tip at the upper cavoatrial junction/upper right atrium. There is no edema, consolidation, effusion, or pneumothorax. Normal heart size and mediastinal contours. IMPRESSION: No evidence of active disease. Electronically Signed   By: Monte Fantasia M.D.   On: 04/29/2017 09:03    Review of Systems  Constitutional: Positive  for chills, fever and malaise/fatigue. Negative for diaphoresis and weight loss.  HENT: Negative for ear discharge, ear pain, hearing loss and tinnitus.   Eyes: Negative for blurred vision, double vision, photophobia and pain.  Respiratory: Negative for cough, sputum production and shortness of breath.   Cardiovascular: Negative for chest pain.  Gastrointestinal: Negative for abdominal pain, nausea and vomiting.  Genitourinary: Negative for dysuria, flank pain, frequency and urgency.  Musculoskeletal: Negative for back pain, falls, joint pain, myalgias and neck pain.  Neurological: Positive for headaches. Negative for dizziness, tingling, sensory change, focal weakness and loss of consciousness.  Endo/Heme/Allergies: Does not bruise/bleed easily.  Psychiatric/Behavioral: Negative for depression, memory loss and substance abuse. The patient is not nervous/anxious.    Blood pressure 120/68, pulse 75, temperature 98.2 F (36.8 C), temperature source Oral, resp. rate (!) 21, height 5' 11"  (1.803 m), weight 85.7 kg (189 lb), SpO2 100 %. Physical Exam  Constitutional: He appears well-developed and well-nourished. No distress.  HENT:  Head: Normocephalic and atraumatic.  Eyes: Conjunctivae are normal. Right eye exhibits no discharge. Left eye exhibits no discharge. No scleral icterus.  Neck: Normal range of motion.  Cardiovascular: Normal rate and regular rhythm.  Respiratory: Effort normal. No respiratory distress.  Musculoskeletal:  Left shoulder, elbow, wrist, digits- extensive scar tissue and keloid formation, limited function, no TTP, no axillary LAD, no erythema or purulent discharge  Rad 2+  Neurological: He is alert.  Skin: Skin is warm and dry. He is not diaphoretic.  Psychiatric: He has a normal mood and affect. His behavior is normal.    Assessment/Plan: FUO -- PICC infection would be at the top of my differential. This does not seem to be stemming from his recent surgery. We will  follow along while he is here.    Lisette Abu, PA-C Orthopedic Surgery [phone] 04/29/2017, 12:55 PM

## 2017-04-29 NOTE — Progress Notes (Signed)
I saw and examined this patient.  I discussed with Drs. Darin EngelsAbraham and [place].  We agreed on a management plan.  Briefly. 32 yo male receiving long term home antibiotics via a picc line for osteomyelitis of the humerus, uncertain organism.  Presents with fever and chills.  No apparent source.  No resp or GI symptoms.  No new skin rash. Imp Fever: before I talk about etiology, I will say does not look severely ill.  No tachycardia or tachypnea.  BP OK.  Lactic acid normal.   Etiology uncertain.  Possibilities include: Acute community viral infection.  He is around kids. Line infection. Has PICC which could obviously be a source. Doubt breakthrough of his osteomyelitits. Drug fever a possibility. 2. AKI with proteinuria and hematuria.  Could be drug induced interstitial nephritis.  Dehydration unlikely (and still we will gently hydrate.)  Pyleo unlikely due to lack of urgency, frequency and dysuria.  (and still we will culture urine.)    Admit.  Since not severely ill, no immediate indication to change antibiotics.  Hydrate, culture, involve ortho and ID.  Follow.

## 2017-04-29 NOTE — Progress Notes (Signed)
[facility]  Mr. Jason Maxwell is an active pt with [facility] on home IVABX. (Vancomycin and Rocephin throught 05-07-17)  If patient discharges after hours, please call [phone].   Sedalia Mutaamela S Chandler 04/29/2017, 8:39 AM

## 2017-04-29 NOTE — ED Notes (Addendum)
Pt was seen at Urgent care yesterday-- dx with the flu-- started on tamiflu-- states "still feels bad" -- pt has PICC line in right upper arm, getting antibiotics for osteomyelitis in left upper arm.

## 2017-04-30 DIAGNOSIS — T80219A Unspecified infection due to central venous catheter, initial encounter: Secondary | ICD-10-CM

## 2017-04-30 DIAGNOSIS — L905 Scar conditions and fibrosis of skin: Secondary | ICD-10-CM

## 2017-04-30 DIAGNOSIS — B961 Klebsiella pneumoniae [K. pneumoniae] as the cause of diseases classified elsewhere: Secondary | ICD-10-CM

## 2017-04-30 DIAGNOSIS — M86322 Chronic multifocal osteomyelitis, left humerus: Secondary | ICD-10-CM

## 2017-04-30 DIAGNOSIS — R7881 Bacteremia: Secondary | ICD-10-CM | POA: Diagnosis present

## 2017-04-30 DIAGNOSIS — D509 Iron deficiency anemia, unspecified: Secondary | ICD-10-CM

## 2017-04-30 LAB — CBC
HCT: 30.4 % — ABNORMAL LOW (ref 39.0–52.0)
Hemoglobin: 10.2 g/dL — ABNORMAL LOW (ref 13.0–17.0)
MCH: 21.8 pg — AB (ref 26.0–34.0)
MCHC: 33.6 g/dL (ref 30.0–36.0)
MCV: 65.1 fL — AB (ref 78.0–100.0)
PLATELETS: 135 10*3/uL — AB (ref 150–400)
RBC: 4.67 MIL/uL (ref 4.22–5.81)
RDW: 16.6 % — AB (ref 11.5–15.5)
WBC: 4.1 10*3/uL (ref 4.0–10.5)

## 2017-04-30 LAB — RESPIRATORY PANEL BY PCR
ADENOVIRUS-RVPPCR: NOT DETECTED
BORDETELLA PERTUSSIS-RVPCR: NOT DETECTED
CHLAMYDOPHILA PNEUMONIAE-RVPPCR: NOT DETECTED
CORONAVIRUS NL63-RVPPCR: NOT DETECTED
Coronavirus 229E: NOT DETECTED
Coronavirus HKU1: NOT DETECTED
Coronavirus OC43: NOT DETECTED
INFLUENZA A-RVPPCR: NOT DETECTED
Influenza B: NOT DETECTED
Metapneumovirus: NOT DETECTED
Mycoplasma pneumoniae: NOT DETECTED
PARAINFLUENZA VIRUS 3-RVPPCR: NOT DETECTED
PARAINFLUENZA VIRUS 4-RVPPCR: NOT DETECTED
Parainfluenza Virus 1: NOT DETECTED
Parainfluenza Virus 2: NOT DETECTED
RHINOVIRUS / ENTEROVIRUS - RVPPCR: NOT DETECTED
Respiratory Syncytial Virus: NOT DETECTED

## 2017-04-30 LAB — BASIC METABOLIC PANEL
Anion gap: 6 (ref 5–15)
BUN: 6 mg/dL (ref 6–20)
CALCIUM: 8 mg/dL — AB (ref 8.9–10.3)
CHLORIDE: 109 mmol/L (ref 101–111)
CO2: 24 mmol/L (ref 22–32)
CREATININE: 1.05 mg/dL (ref 0.61–1.24)
GFR calc Af Amer: >60 mL/min (ref >60)
GFR calc non Af Amer: >60 mL/min (ref >60)
GLUCOSE: 99 mg/dL (ref 65–99)
Potassium: 3.7 mmol/L (ref 3.5–5.1)
Sodium: 139 mmol/L (ref 135–145)
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
## 2017-04-30 NOTE — Progress Notes (Signed)
PT Cancellation Note  Patient Details Name: Jason Maxwell MRN: [id number] DOB: [birth year]   Cancelled Treatment:    Reason Eval/Treat Not Completed: PT screened, no needs identified, will sign off RN reports pt has been ambulating independently within the room. No skilled needs noted. Will sign off. If needs change, please re-consult.   Gladys DammeBrittany Makenzi Bannister, PT, DPT  Acute Rehabilitation Services  Pager: [phone]  Lehman PromBrittany S Bently Morath 04/30/2017, 1:00 PM

## 2017-04-30 NOTE — Progress Notes (Signed)
Patient ID: Jason Maxwell, male   DOB: 05/17/1985, 32 y.o.   MRN: [id number]          [facility]  Date of Admission:  04/29/2017   Total days of antibiotics 49         ASSESSMENT: He had a PICC line infection complicated by Klebsiella bacteremia.  He improved as soon as the PICC was removed last night without any change in his antibiotic therapy.  I am hopeful that his isolate will be susceptible to an oral agent and we can discharge him home tomorrow.  PLAN: 1. Continue current antibiotics pending final blood culture results  Principal Problem:   PICC line infection Active Problems:   Bacteremia due to Klebsiella pneumoniae   Osteomyelitis of left humerus (HCC)   Microcytic anemia   Scheduled Meds: . enoxaparin (LOVENOX) injection  40 mg Subcutaneous Q24H  . levofloxacin  750 mg Oral Daily  . rifampin  300 mg Oral Q12H   Continuous Infusions: . ceftaroline (TEFLARO) 600 mg IVPB Stopped (04/30/17 1130)   PRN Meds:.acetaminophen **OR** acetaminophen, polyethylene glycol   SUBJECTIVE: He is feeling much better today.  Review of Systems: Review of Systems  Constitutional: Negative for chills, fever and malaise/fatigue.  Gastrointestinal: Negative for abdominal pain, diarrhea, nausea and vomiting.  Genitourinary: Negative for dysuria.  Neurological: Positive for headaches.       His headache has improved    Allergies  Allergen Reactions  . Pork-Derived Products     Religious reasons     OBJECTIVE: Vitals:   04/29/17 2200 04/30/17 0447 04/30/17 0925 04/30/17 1357  BP: (!) 103/52 120/66 (!) 130/100 (!) 143/95  Pulse: 95 (!) 58 72 71  Resp: 17 18 18 18   Temp: 99.2 F (37.3 C) 98.8 F (37.1 C) 97.8 F (36.6 C) 98.3 F (36.8 C)  TempSrc: Oral Oral Oral Oral  SpO2: 99% 100% 100% 100%  Weight:      Height:       Body mass index is 26.36 kg/m.  Physical Exam  Constitutional: He is oriented to person, place, and time.  He is  smiling and in good spirits.  I examined him with the aid of the [place] interpreter.  Cardiovascular: Normal rate and regular rhythm.  No murmur heard. Pulmonary/Chest: Effort normal. He has no wheezes. He has no rales.  Abdominal: Soft. He exhibits no distension. There is no tenderness.  Musculoskeletal:  Extensive scarring in left arm but no signs of acute infection.  Neurological: He is alert and oriented to person, place, and time.  Skin: No rash noted.  Right arm PICC has been removed.  Psychiatric: Mood and affect normal.    Lab Results Lab Results  Component Value Date   WBC 4.1 04/30/2017   HGB 10.2 (L) 04/30/2017   HCT 30.4 (L) 04/30/2017   MCV 65.1 (L) 04/30/2017   PLT 135 (L) 04/30/2017    Lab Results  Component Value Date   CREATININE 1.05 04/30/2017   BUN 6 04/30/2017   NA 139 04/30/2017   K 3.7 04/30/2017   CL 109 04/30/2017   CO2 24 04/30/2017    Lab Results  Component Value Date   ALT 56 04/29/2017   AST 72 (H) 04/29/2017   ALKPHOS 98 04/29/2017   BILITOT 1.2 04/29/2017     Microbiology: Recent Results (from the past 240 hour(s))  Blood Culture (routine x 2)     Status: Abnormal (Preliminary result)   Collection Time: 04/29/17  9:05 AM  Result Value Ref Range Status   Specimen Description BLOOD PICC LINE  Final   Special Requests   Final    BOTTLES DRAWN AEROBIC AND ANAEROBIC Blood Culture adequate volume   Culture  Setup Time   Final    GRAM NEGATIVE RODS IN BOTH AEROBIC AND ANAEROBIC BOTTLES CRITICAL RESULT CALLED TO, READ BACK BY AND VERIFIED WITH: Patrick North [facility] 2038 04/29/17 A BROWNING    Culture (A)  Final    KLEBSIELLA PNEUMONIAE SUSCEPTIBILITIES TO FOLLOW Performed at [facility], [address] [place], [place] [address]    Report Status PENDING  Incomplete  Blood Culture ID Panel (Reflexed)     Status: Abnormal   Collection Time: 04/29/17  9:05 AM  Result Value Ref Range Status   Enterococcus species NOT  DETECTED NOT DETECTED Final   Listeria monocytogenes NOT DETECTED NOT DETECTED Final   Staphylococcus species NOT DETECTED NOT DETECTED Final   Staphylococcus aureus NOT DETECTED NOT DETECTED Final   Streptococcus species NOT DETECTED NOT DETECTED Final   Streptococcus agalactiae NOT DETECTED NOT DETECTED Final   Streptococcus pneumoniae NOT DETECTED NOT DETECTED Final   Streptococcus pyogenes NOT DETECTED NOT DETECTED Final   Acinetobacter baumannii NOT DETECTED NOT DETECTED Final   Enterobacteriaceae species DETECTED (A) NOT DETECTED Final    Comment: Enterobacteriaceae represent a large family of gram-negative bacteria, not a single organism. CRITICAL RESULT CALLED TO, READ BACK BY AND VERIFIED WITH: Patrick North [facility] 2038 04/29/17 A BROWNING    Enterobacter cloacae complex NOT DETECTED NOT DETECTED Final   Escherichia coli NOT DETECTED NOT DETECTED Final   Klebsiella oxytoca NOT DETECTED NOT DETECTED Final   Klebsiella pneumoniae DETECTED (A) NOT DETECTED Final    Comment: CRITICAL RESULT CALLED TO, READ BACK BY AND VERIFIED WITH: Patrick North [facility] 2038 04/29/17 A BROWNING    Proteus species NOT DETECTED NOT DETECTED Final   Serratia marcescens NOT DETECTED NOT DETECTED Final   Carbapenem resistance NOT DETECTED NOT DETECTED Final   Haemophilus influenzae NOT DETECTED NOT DETECTED Final   Neisseria meningitidis NOT DETECTED NOT DETECTED Final   Pseudomonas aeruginosa NOT DETECTED NOT DETECTED Final   Candida albicans NOT DETECTED NOT DETECTED Final   Candida glabrata NOT DETECTED NOT DETECTED Final   Candida krusei NOT DETECTED NOT DETECTED Final   Candida parapsilosis NOT DETECTED NOT DETECTED Final   Candida tropicalis NOT DETECTED NOT DETECTED Final    Comment: Performed at [facility], [address] [place], [place] [address]  Blood Culture (routine x 2)     Status: None (Preliminary result)   Collection Time: 04/29/17  9:39 AM  Result Value Ref Range Status    Specimen Description BLOOD RIGHT FOREARM  Final   Special Requests   Final    BOTTLES DRAWN AEROBIC AND ANAEROBIC Blood Culture adequate volume   Culture   Final    NO GROWTH 1 DAY Performed at [facility], [address] [place], [place] [address]    Report Status PENDING  Incomplete  Blood culture (routine x 2)     Status: Abnormal (Preliminary result)   Collection Time: 04/29/17 12:05 PM  Result Value Ref Range Status   Specimen Description BLOOD CENTRAL LINE PORTA CATH  Final   Special Requests   Final    BOTTLES DRAWN AEROBIC AND ANAEROBIC Blood Culture adequate volume   Culture  Setup Time   Final    GRAM NEGATIVE RODS IN  BOTH AEROBIC AND ANAEROBIC BOTTLES CRITICAL VALUE NOTED.  VALUE IS CONSISTENT WITH PREVIOUSLY REPORTED AND CALLED VALUE. Performed at [facility], [name] [place], [place] [address]    Culture KLEBSIELLA PNEUMONIAE (A)  Final   Report Status PENDING  Incomplete  Blood culture (routine x 2)     Status: None (Preliminary result)   Collection Time: 04/29/17  4:52 PM  Result Value Ref Range Status   Specimen Description BLOOD RIGHT ANTECUBITAL  Final   Special Requests   Final    BOTTLES DRAWN AEROBIC ONLY Blood Culture adequate volume   Culture   Final    NO GROWTH < 24 HOURS Performed at [facility], [address] [place], [place] [address]    Report Status PENDING  Incomplete  Culture, Urine     Status: None (Preliminary result)   Collection Time: 04/29/17  4:57 PM  Result Value Ref Range Status   Specimen Description URINE, CLEAN CATCH  Final   Special Requests NONE  Final   Culture   Final    NO GROWTH Performed at [facility], [address] [place], [place] [address]    Report Status PENDING  Incomplete  Respiratory Panel by PCR     Status: None   Collection Time: 04/29/17  6:46 PM  Result Value Ref Range Status   Adenovirus NOT DETECTED NOT DETECTED Final   Coronavirus 229E NOT DETECTED NOT DETECTED  Final   Coronavirus HKU1 NOT DETECTED NOT DETECTED Final   Coronavirus NL63 NOT DETECTED NOT DETECTED Final   Coronavirus OC43 NOT DETECTED NOT DETECTED Final   Metapneumovirus NOT DETECTED NOT DETECTED Final   Rhinovirus / Enterovirus NOT DETECTED NOT DETECTED Final   Influenza A NOT DETECTED NOT DETECTED Final   Influenza B NOT DETECTED NOT DETECTED Final   Parainfluenza Virus 1 NOT DETECTED NOT DETECTED Final   Parainfluenza Virus 2 NOT DETECTED NOT DETECTED Final   Parainfluenza Virus 3 NOT DETECTED NOT DETECTED Final   Parainfluenza Virus 4 NOT DETECTED NOT DETECTED Final   Respiratory Syncytial Virus NOT DETECTED NOT DETECTED Final   Bordetella pertussis NOT DETECTED NOT DETECTED Final   Chlamydophila pneumoniae NOT DETECTED NOT DETECTED Final   Mycoplasma pneumoniae NOT DETECTED NOT DETECTED Final    Cliffton AstersJohn Oliver Neuwirth, MD [facility] [phone] pager   [phone] cell 04/30/2017, 4:09 PM

## 2017-05-01 DIAGNOSIS — R7881 Bacteremia: Secondary | ICD-10-CM

## 2017-05-01 DIAGNOSIS — T148XXA Other injury of unspecified body region, initial encounter: Secondary | ICD-10-CM

## 2017-05-01 LAB — URINE CULTURE: CULTURE: NO GROWTH

## 2017-05-01 LAB — BASIC METABOLIC PANEL
Anion gap: 8 (ref 5–15)
BUN: 6 mg/dL (ref 6–20)
CO2: 23 mmol/L (ref 22–32)
Calcium: 8.1 mg/dL — ABNORMAL LOW (ref 8.9–10.3)
Chloride: 109 mmol/L (ref 101–111)
Creatinine, Ser: 1.04 mg/dL (ref 0.61–1.24)
GFR calc Af Amer: >60 mL/min (ref >60)
Glucose, Bld: 99 mg/dL (ref 65–99)
Potassium: 3.3 mmol/L — ABNORMAL LOW (ref 3.5–5.1)
SODIUM: 140 mmol/L (ref 135–145)

## 2017-05-01 LAB — CBC
HCT: 30.8 % — ABNORMAL LOW (ref 39.0–52.0)
Hemoglobin: 10.5 g/dL — ABNORMAL LOW (ref 13.0–17.0)
MCH: 22.2 pg — AB (ref 26.0–34.0)
MCHC: 34.1 g/dL (ref 30.0–36.0)
MCV: 65.1 fL — ABNORMAL LOW (ref 78.0–100.0)
PLATELETS: 150 10*3/uL (ref 150–400)
RBC: 4.73 MIL/uL (ref 4.22–5.81)
RDW: 17.1 % — ABNORMAL HIGH (ref 11.5–15.5)
WBC: 3.2 10*3/uL — ABNORMAL LOW (ref 4.0–10.5)

## 2017-05-01 MED ORDER — LINEZOLID 600 MG PO TABS
600.0000 mg | ORAL_TABLET | Freq: Two times a day (BID) | ORAL | Status: DC
  Administered 2017-05-01 – 2017-05-02 (×2): 600 mg via ORAL
  Filled 2017-05-01 (×3): qty 1

## 2017-05-01 MED ORDER — DEXTROSE 5 % IV SOLN
2.5000 g | Freq: Three times a day (TID) | INTRAVENOUS | Status: DC
  Administered 2017-05-01 – 2017-05-02 (×3): 2.5 g via INTRAVENOUS
  Filled 2017-05-01 (×4): qty 12

## 2017-05-01 NOTE — Progress Notes (Signed)
Patient ID: Jason Maxwell, male   DOB: [birth year], 32 y.o.   MRN: [id number]         [facility]  Date of Admission:  04/29/2017   Total days of antibiotics 50         ASSESSMENT: He had a PICC line infection complicated by Klebsiella bacteremia.  He improved as soon as the PICC was removed without any change in his antibiotic therapy.  It looks like his Klebsiella may be a Klebsiella pneumoniae carbapenemase (KPC)-producing organism.  We should know by tomorrow morning.  Although he is markedly improved and afebrile I will change ceftaroline to ceftazadime-avibactam pending final culture results.  Repeat blood cultures were obtained this morning.  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  I will change levofloxacin to linezolid and continue rifampin.  He may need to stay here for IV antibiotic therapy at least 5 days for his bacteremia.  I am not sure if we will be able to arrange outpatient therapy with ceftazadime-avibactam.  He is discouraged by this development and the need to stay in the hospital but understands.  PLAN: 1. Change ceftaroline to ceftazadime-avibactam 2. Change levofloxacin to linezolid 3. Continue rifampin 4. Await results of repeat cultures 5. Contact precautions  Principal Problem:   PICC line infection Active Problems:   Bacteremia due to Klebsiella pneumoniae   Osteomyelitis of left humerus (HCC)   Microcytic anemia   Scheduled Meds: . enoxaparin (LOVENOX) injection  40 mg Subcutaneous Q24H  . levofloxacin  750 mg Oral Daily  . rifampin  300 mg Oral Q12H   Continuous Infusions: . ceftaroline (TEFLARO) 600 mg IVPB Stopped (05/01/17 1143)   PRN Meds:.acetaminophen **OR** acetaminophen, polyethylene glycol   SUBJECTIVE: He is still having some chills but overall is feeling much better.  Review of Systems: Review of Systems  Constitutional: Positive for chills. Negative for fever and  malaise/fatigue.  Gastrointestinal: Negative for abdominal pain, diarrhea, nausea and vomiting.  Genitourinary: Negative for dysuria.  Neurological: Negative for headaches.    Allergies  Allergen Reactions  . Pork-Derived Products     Religious reasons     OBJECTIVE: Vitals:   04/30/17 1357 04/30/17 2206 05/01/17 0527 05/01/17 0745  BP: (!) 143/95 122/75 125/76 (!) 130/93  Pulse: 71 72 62 (!) 58  Resp: 18 18 18 14   Temp: 98.3 F (36.8 C) 98 F (36.7 C) 98.4 F (36.9 C) 97.9 F (36.6 C)  TempSrc: Oral Oral Oral Oral  SpO2: 100% 99% 98% 100%  Weight:      Height:       Body mass index is 26.36 kg/m.  Physical Exam  Constitutional: He is oriented to person, place, and time.  He is smiling and in good spirits.  I examined him with the aid of the [place] interpreter.  Cardiovascular: Normal rate and regular rhythm.  No murmur heard. Pulmonary/Chest: Effort normal. He has no wheezes. He has no rales.  Abdominal: Soft. He exhibits no distension. There is no tenderness.  Musculoskeletal:  Extensive scarring in left arm but no signs of acute infection.  Neurological: He is alert and oriented to person, place, and time.  Skin: No rash noted.  Psychiatric: Mood and affect normal.    Lab Results Lab Results  Component Value Date   WBC 3.2 (L) 05/01/2017   HGB 10.5 (L) 05/01/2017   HCT 30.8 (L) 05/01/2017   MCV 65.1 (L) 05/01/2017   PLT 150 05/01/2017  Lab Results  Component Value Date   CREATININE 1.04 05/01/2017   BUN 6 05/01/2017   NA 140 05/01/2017   K 3.3 (L) 05/01/2017   CL 109 05/01/2017   CO2 23 05/01/2017    Lab Results  Component Value Date   ALT 56 04/29/2017   AST 72 (H) 04/29/2017   ALKPHOS 98 04/29/2017   BILITOT 1.2 04/29/2017     Microbiology: Recent Results (from the past 240 hour(s))  Blood Culture (routine x 2)     Status: Abnormal (Preliminary result)   Collection Time: 04/29/17  9:05 AM  Result Value Ref Range Status    Specimen Description BLOOD PICC LINE  Final   Special Requests   Final    BOTTLES DRAWN AEROBIC AND ANAEROBIC Blood Culture adequate volume   Culture  Setup Time   Final    GRAM NEGATIVE RODS IN BOTH AEROBIC AND ANAEROBIC BOTTLES CRITICAL RESULT CALLED TO, READ BACK BY AND VERIFIED WITH: Patrick North [facility] 2038 04/29/17 A BROWNING    Culture (A)  Final    KLEBSIELLA PNEUMONIAE REPEATING SUSCEPTIBILITIES Performed at [facility], [address] [place], [place] [address]    Report Status PENDING  Incomplete  Blood Culture ID Panel (Reflexed)     Status: Abnormal   Collection Time: 04/29/17  9:05 AM  Result Value Ref Range Status   Enterococcus species NOT DETECTED NOT DETECTED Final   Listeria monocytogenes NOT DETECTED NOT DETECTED Final   Staphylococcus species NOT DETECTED NOT DETECTED Final   Staphylococcus aureus NOT DETECTED NOT DETECTED Final   Streptococcus species NOT DETECTED NOT DETECTED Final   Streptococcus agalactiae NOT DETECTED NOT DETECTED Final   Streptococcus pneumoniae NOT DETECTED NOT DETECTED Final   Streptococcus pyogenes NOT DETECTED NOT DETECTED Final   Acinetobacter baumannii NOT DETECTED NOT DETECTED Final   Enterobacteriaceae species DETECTED (A) NOT DETECTED Final    Comment: Enterobacteriaceae represent a large family of gram-negative bacteria, not a single organism. CRITICAL RESULT CALLED TO, READ BACK BY AND VERIFIED WITH: Patrick North [facility] 2038 04/29/17 A BROWNING    Enterobacter cloacae complex NOT DETECTED NOT DETECTED Final   Escherichia coli NOT DETECTED NOT DETECTED Final   Klebsiella oxytoca NOT DETECTED NOT DETECTED Final   Klebsiella pneumoniae DETECTED (A) NOT DETECTED Final    Comment: CRITICAL RESULT CALLED TO, READ BACK BY AND VERIFIED WITH: Patrick North [facility] 2038 04/29/17 A BROWNING    Proteus species NOT DETECTED NOT DETECTED Final   Serratia marcescens NOT DETECTED NOT DETECTED Final   Carbapenem resistance NOT DETECTED  NOT DETECTED Final   Haemophilus influenzae NOT DETECTED NOT DETECTED Final   Neisseria meningitidis NOT DETECTED NOT DETECTED Final   Pseudomonas aeruginosa NOT DETECTED NOT DETECTED Final   Candida albicans NOT DETECTED NOT DETECTED Final   Candida glabrata NOT DETECTED NOT DETECTED Final   Candida krusei NOT DETECTED NOT DETECTED Final   Candida parapsilosis NOT DETECTED NOT DETECTED Final   Candida tropicalis NOT DETECTED NOT DETECTED Final    Comment: Performed at [facility], [address] [place], [place] [address]  Blood Culture (routine x 2)     Status: None (Preliminary result)   Collection Time: 04/29/17  9:39 AM  Result Value Ref Range Status   Specimen Description BLOOD RIGHT FOREARM  Final   Special Requests   Final    BOTTLES DRAWN AEROBIC AND ANAEROBIC Blood Culture adequate volume   Culture   Final  NO GROWTH 2 DAYS Performed at [facility], [address], [place], [place] [address]    Report Status PENDING  Incomplete  Blood culture (routine x 2)     Status: Abnormal (Preliminary result)   Collection Time: 04/29/17 12:05 PM  Result Value Ref Range Status   Specimen Description BLOOD CENTRAL LINE PORTA CATH  Final   Special Requests   Final    BOTTLES DRAWN AEROBIC AND ANAEROBIC Blood Culture adequate volume   Culture  Setup Time   Final    GRAM NEGATIVE RODS IN BOTH AEROBIC AND ANAEROBIC BOTTLES CRITICAL VALUE NOTED.  VALUE IS CONSISTENT WITH PREVIOUSLY REPORTED AND CALLED VALUE. Performed at [facility], [address], [place], [place] [address]    Culture KLEBSIELLA PNEUMONIAE (A)  Final   Report Status PENDING  Incomplete  Blood culture (routine x 2)     Status: None (Preliminary result)   Collection Time: 04/29/17  4:52 PM  Result Value Ref Range Status   Specimen Description BLOOD RIGHT ANTECUBITAL  Final   Special Requests   Final    BOTTLES DRAWN AEROBIC ONLY Blood Culture adequate volume   Culture   Final    NO GROWTH 2  DAYS Performed at [facility], [address], [place], [place] [address]    Report Status PENDING  Incomplete  Culture, Urine     Status: None   Collection Time: 04/29/17  4:57 PM  Result Value Ref Range Status   Specimen Description URINE, CLEAN CATCH  Final   Special Requests NONE  Final   Culture   Final    NO GROWTH Performed at [facility], [address] [place], [place] [address]    Report Status 05/01/2017 FINAL  Final  Respiratory Panel by PCR     Status: None   Collection Time: 04/29/17  6:46 PM  Result Value Ref Range Status   Adenovirus NOT DETECTED NOT DETECTED Final   Coronavirus 229E NOT DETECTED NOT DETECTED Final   Coronavirus HKU1 NOT DETECTED NOT DETECTED Final   Coronavirus NL63 NOT DETECTED NOT DETECTED Final   Coronavirus OC43 NOT DETECTED NOT DETECTED Final   Metapneumovirus NOT DETECTED NOT DETECTED Final   Rhinovirus / Enterovirus NOT DETECTED NOT DETECTED Final   Influenza A NOT DETECTED NOT DETECTED Final   Influenza B NOT DETECTED NOT DETECTED Final   Parainfluenza Virus 1 NOT DETECTED NOT DETECTED Final   Parainfluenza Virus 2 NOT DETECTED NOT DETECTED Final   Parainfluenza Virus 3 NOT DETECTED NOT DETECTED Final   Parainfluenza Virus 4 NOT DETECTED NOT DETECTED Final   Respiratory Syncytial Virus NOT DETECTED NOT DETECTED Final   Bordetella pertussis NOT DETECTED NOT DETECTED Final   Chlamydophila pneumoniae NOT DETECTED NOT DETECTED Final   Mycoplasma pneumoniae NOT DETECTED NOT DETECTED Final    Cliffton Asters, MD [facility] [phone] pager   [phone] cell 05/01/2017, 12:11 PM
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
## 2017-05-01 NOTE — Plan of Care (Signed)
  Problem: Clinical Measurements: Goal: Diagnostic test results will improve Outcome: Progressing   

## 2017-05-01 NOTE — Evaluation (Signed)
Occupational Therapy Evaluation Patient Details Name: Jason Maxwell MRN: [id number] DOB: 02/11/1985 Today's Date: 05/01/2017    History of Present Illness Jason Maxwell presents with family with complaints of back pain, headache, fevers, 1 episode of vomiting, slight cough and congestion. L humeral fx surgery 03/2017   Clinical Impression   Pt is at Mod independent baseline level of function, however is unable to grip with L hand; can dangle L shoulder and is able to complete his basic selfcare. Pt will have family assist at home prn. Pt instructed on L UE ROM exercises with handout provided. All education completed and no further acute OT is indicated at this time    Follow Up Recommendations  Outpatient OT    Equipment Recommendations  None recommended by OT    Recommendations for Other Services       Precautions / Restrictions Precautions Precautions: Other (comment) Precaution Comments: contact - MRSA Restrictions Weight Bearing Restrictions: Yes LUE Weight Bearing: Non weight bearing      Mobility Bed Mobility Overal bed mobility: Independent                Transfers Overall transfer level: Independent                    Balance Overall balance assessment: No apparent balance deficits (not formally assessed)                                         ADL either performed or assessed with clinical judgement   ADL Overall ADL's : Modified independent;At baseline                                             Vision Patient Visual Report: No change from baseline       Perception     Praxis      Pertinent Vitals/Pain Pain Assessment: No/denies pain     Hand Dominance Right   Extremity/Trunk Assessment Upper Extremity Assessment Upper Extremity Assessment: Overall WFL for tasks assessed;LUE deficits/detail LUE Deficits / Details: s/p L humeral fx surgery 03/2017, still NWB?   Lower Extremity  Assessment Lower Extremity Assessment: Defer to PT evaluation   Cervical / Trunk Assessment Cervical / Trunk Assessment: Normal   Communication Communication Communication: Prefers language other than English   Cognition Arousal/Alertness: Awake/alert Behavior During Therapy: WFL for tasks assessed/performed Overall Cognitive Status: Within Functional Limits for tasks assessed                                     General Comments       Exercises Other Exercises Other Exercises: pt able to dangle L UE, unable to use hand/fingers to grasp Other Exercises: instructed pt on L UE ROM exercises   Shoulder Instructions      Home Living Family/patient expects to be discharged to:: Private residence Living Arrangements: Other relatives   Type of Home: House       Home Layout: One level     Bathroom Shower/Tub: Chief Strategy OfficerTub/shower unit   Bathroom Toilet: Standard     Home Equipment: None          Prior Functioning/Environment Level of Independence: Independent  OT Problem List: Decreased range of motion;Impaired UE functional use      OT Treatment/Interventions:      OT Goals(Current goals can be found in the care plan section) Acute Rehab OT Goals Patient Stated Goal: go home OT Goal Formulation: With patient  OT Frequency:     Barriers to D/C:            Co-evaluation              AM-PAC PT "6 Clicks" Daily Activity     Outcome Measure Help from another person eating meals?: None Help from another person taking care of personal grooming?: None Help from another person toileting, which includes using toliet, bedpan, or urinal?: None Help from another person bathing (including washing, rinsing, drying)?: None Help from another person to put on and taking off regular upper body clothing?: None Help from another person to put on and taking off regular lower body clothing?: None 6 Click Score: 24   End of Session     Activity Tolerance: Patient tolerated treatment well Patient left: in bed  OT Visit Diagnosis: Pain Pain - Right/Left: Left Pain - part of body: Shoulder                Time: 1610-9604 OT Time Calculation (min): 18 min Charges:  OT General Charges $OT Visit: 1 Visit OT Evaluation $OT Eval Low Complexity: 1 Low G-Codes: OT G-codes **NOT FOR INPATIENT CLASS** Functional Assessment Tool Used: AM-PAC 6 Clicks Daily Activity     Jason Maxwell 05/01/2017, 1:35 PM

## 2017-05-02 ENCOUNTER — Other Ambulatory Visit: Payer: Self-pay | Admitting: Pharmacist Clinician (PhC)/ Clinical Pharmacy Specialist

## 2017-05-02 DIAGNOSIS — Z1629 Resistance to other single specified antibiotic: Secondary | ICD-10-CM

## 2017-05-02 LAB — CULTURE, BLOOD (ROUTINE X 2)
SPECIAL REQUESTS: ADEQUATE
Special Requests: ADEQUATE

## 2017-05-02 LAB — CBC
HCT: 31.3 % — ABNORMAL LOW (ref 39.0–52.0)
Hemoglobin: 10.7 g/dL — ABNORMAL LOW (ref 13.0–17.0)
MCH: 22.3 pg — ABNORMAL LOW (ref 26.0–34.0)
MCHC: 34.2 g/dL (ref 30.0–36.0)
MCV: 65.3 fL — AB (ref 78.0–100.0)
PLATELETS: 200 10*3/uL (ref 150–400)
RBC: 4.79 MIL/uL (ref 4.22–5.81)
RDW: 17.3 % — ABNORMAL HIGH (ref 11.5–15.5)
WBC: 3.4 10*3/uL — AB (ref 4.0–10.5)

## 2017-05-02 LAB — BASIC METABOLIC PANEL
ANION GAP: 8 (ref 5–15)
BUN: 5 mg/dL — ABNORMAL LOW (ref 6–20)
CO2: 23 mmol/L (ref 22–32)
Calcium: 8.3 mg/dL — ABNORMAL LOW (ref 8.9–10.3)
Chloride: 109 mmol/L (ref 101–111)
Creatinine, Ser: 1.06 mg/dL (ref 0.61–1.24)
GFR calc Af Amer: >60 mL/min (ref >60)
GLUCOSE: 96 mg/dL (ref 65–99)
POTASSIUM: 3.2 mmol/L — AB (ref 3.5–5.1)
SODIUM: 140 mmol/L (ref 135–145)

## 2017-05-02 MED ORDER — SULFAMETHOXAZOLE-TRIMETHOPRIM 800-160 MG PO TABS: 2.0000 | ORAL_TABLET | Freq: Two times a day (BID) | ORAL | 0 refills | Status: DC

## 2017-05-02 MED ORDER — POLYETHYLENE GLYCOL 3350 17 G PO PACK: 17.0000 g | PACK | Freq: Every day | ORAL | 0 refills | Status: DC | PRN

## 2017-05-02 MED ORDER — TEDIZOLID PHOSPHATE 200 MG PO TABS: 200.0000 mg | ORAL_TABLET | Freq: Every day | ORAL | 0 refills | Status: DC

## 2017-05-02 MED ORDER — POTASSIUM CHLORIDE CRYS ER 20 MEQ PO TBCR
40.0000 meq | EXTENDED_RELEASE_TABLET | Freq: Once | ORAL | Status: AC
  Administered 2017-05-02: 40 meq via ORAL
  Filled 2017-05-02: qty 2

## 2017-05-02 MED FILL — SULFAMETHOXAZOLE-TMP DS TAB: 800-160 | 4 days supply | Qty: 16 | Fill #0

## 2017-05-02 NOTE — Progress Notes (Signed)
Patient ID: Jason Maxwell, male   DOB: 1986/01/05, 32 y.o.   MRN: [id number]         [facility] for Infectious Disease  Date of Admission:  04/29/2017   Total days of antibiotics 51         ASSESSMENT: We have gotten very good news from the lab.  He did have Klebsiella bacteremia related to his recently removed PICC but it is not a multidrug-resistant strain as suspected yesterday.  It is sensitive to the ceftaroline he was on but resistant to levofloxacin.  He can complete therapy for his bacteremia with 4 more days of oral trimethoprim sulfamethoxazole.  He will not need to have a PICC replaced.  Our plan is to continue therapy for chronic left humeral osteomyelitis with oral tedizolid and rifampin.  I have gone over these plans today with him with the help of our ID pharmacist, Ulyses Southward.  He will be provided a drug calendar in [place] and we will arrange follow-up in our clinic.  PLAN: 1. Discontinue ceftazadime-avibactam and contact precautions 2. He will be given a 1 month supply of tedizolid and a 4-day supply of trimethoprim sulfamethoxazole. 3. He has rifampin at home 4. I will arrange follow-up in our clinic and sign off now  Principal Problem:   PICC line infection Active Problems:   Bacteremia due to Klebsiella pneumoniae   Osteomyelitis of left humerus (HCC)   Microcytic anemia   Scheduled Meds: . enoxaparin (LOVENOX) injection  40 mg Subcutaneous Q24H  . linezolid  600 mg Oral Q12H  . rifampin  300 mg Oral Q12H   Continuous Infusions: . ceftazidime avibactam (AVYCAZ) IVPB Stopped (05/02/17 0724)   PRN Meds:.acetaminophen **OR** acetaminophen, polyethylene glycol   SUBJECTIVE: He is feeling much better.  Review of Systems: Review of Systems  Constitutional: Negative for chills, fever and malaise/fatigue.  Gastrointestinal: Negative for abdominal pain, diarrhea, nausea and vomiting.  Genitourinary: Negative for dysuria.  Neurological: Negative for  headaches.    Allergies  Allergen Reactions  . Pork-Derived Products     Religious reasons     OBJECTIVE: Vitals:   05/01/17 0745 05/01/17 1327 05/01/17 2050 05/02/17 0510  BP: (!) 130/93 118/65 130/86 121/75  Pulse: (!) 58 62 60 (!) 58  Resp: 14 14 17 20   Temp: 97.9 F (36.6 C) 98.2 F (36.8 C) (!) 97.5 F (36.4 C) 97.7 F (36.5 C)  TempSrc: Oral Oral Oral Oral  SpO2: 100% 100% 100% 97%  Weight:      Height:       Body mass index is 26.36 kg/m.  Physical Exam  Constitutional: He is oriented to person, place, and time.  He is in good spirits.  I examined him with the aid of the [place] interpreter.  Cardiovascular: Normal rate and regular rhythm.  No murmur heard. Pulmonary/Chest: Effort normal. He has no wheezes. He has no rales.  Abdominal: Soft. He exhibits no distension. There is no tenderness.  Musculoskeletal:  Extensive scarring in left arm but no signs of acute infection.  Neurological: He is alert and oriented to person, place, and time.  Skin: No rash noted.  Psychiatric: Mood and affect normal.    Lab Results Lab Results  Component Value Date   WBC 3.4 (L) 05/02/2017   HGB 10.7 (L) 05/02/2017   HCT 31.3 (L) 05/02/2017   MCV 65.3 (L) 05/02/2017   PLT 200 05/02/2017    Lab Results  Component Value Date   CREATININE 1.06 05/02/2017  BUN 5 (L) 05/02/2017   NA 140 05/02/2017   K 3.2 (L) 05/02/2017   CL 109 05/02/2017   CO2 23 05/02/2017    Lab Results  Component Value Date   ALT 56 04/29/2017   AST 72 (H) 04/29/2017   ALKPHOS 98 04/29/2017   BILITOT 1.2 04/29/2017     Microbiology: Recent Results (from the past 240 hour(s))  Blood Culture (routine x 2)     Status: Abnormal (Preliminary result)   Collection Time: 04/29/17  9:05 AM  Result Value Ref Range Status   Specimen Description BLOOD PICC LINE  Final   Special Requests   Final    BOTTLES DRAWN AEROBIC AND ANAEROBIC Blood Culture adequate volume   Culture  Setup Time    Final    GRAM NEGATIVE RODS IN BOTH AEROBIC AND ANAEROBIC BOTTLES CRITICAL RESULT CALLED TO, READ BACK BY AND VERIFIED WITHPatrick [facility] 2038 04/29/17 A BROWNING Performed at [facility], [address] [place], [place] [address]    Culture KLEBSIELLA PNEUMONIAE (A)  Final   Report Status PENDING  Incomplete   Organism ID, Bacteria KLEBSIELLA PNEUMONIAE  Final      Susceptibility   Klebsiella pneumoniae - MIC*    AMPICILLIN >=32 RESISTANT Resistant     CEFAZOLIN <=4 SENSITIVE Sensitive     CEFEPIME <=1 SENSITIVE Sensitive     CEFTAZIDIME <=1 SENSITIVE Sensitive     CEFTRIAXONE <=1 SENSITIVE Sensitive     CIPROFLOXACIN >=4 RESISTANT Resistant     GENTAMICIN <=1 SENSITIVE Sensitive     IMIPENEM <=0.25 SENSITIVE Sensitive     TRIMETH/SULFA <=20 SENSITIVE Sensitive     AMPICILLIN/SULBACTAM 8 SENSITIVE Sensitive     PIP/TAZO <=4 SENSITIVE Sensitive     Extended ESBL NEGATIVE Sensitive     * KLEBSIELLA PNEUMONIAE  Blood Culture ID Panel (Reflexed)     Status: Abnormal   Collection Time: 04/29/17  9:05 AM  Result Value Ref Range Status   Enterococcus species NOT DETECTED NOT DETECTED Final   Listeria monocytogenes NOT DETECTED NOT DETECTED Final   Staphylococcus species NOT DETECTED NOT DETECTED Final   Staphylococcus aureus NOT DETECTED NOT DETECTED Final   Streptococcus species NOT DETECTED NOT DETECTED Final   Streptococcus agalactiae NOT DETECTED NOT DETECTED Final   Streptococcus pneumoniae NOT DETECTED NOT DETECTED Final   Streptococcus pyogenes NOT DETECTED NOT DETECTED Final   Acinetobacter baumannii NOT DETECTED NOT DETECTED Final   Enterobacteriaceae species DETECTED (A) NOT DETECTED Final    Comment: Enterobacteriaceae represent a large family of gram-negative bacteria, not a single organism. CRITICAL RESULT CALLED TO, READ BACK BY AND VERIFIED WITH: Patrick [facility] 2038 04/29/17 A BROWNING    Enterobacter cloacae complex NOT DETECTED NOT DETECTED Final     Escherichia coli NOT DETECTED NOT DETECTED Final   Klebsiella oxytoca NOT DETECTED NOT DETECTED Final   Klebsiella pneumoniae DETECTED (A) NOT DETECTED Final    Comment: CRITICAL RESULT CALLED TO, READ BACK BY AND VERIFIED WITH: Patrick [facility] 2038 04/29/17 A BROWNING    Proteus species NOT DETECTED NOT DETECTED Final   Serratia marcescens NOT DETECTED NOT DETECTED Final   Carbapenem resistance NOT DETECTED NOT DETECTED Final   Haemophilus influenzae NOT DETECTED NOT DETECTED Final   Neisseria meningitidis NOT DETECTED NOT DETECTED Final   Pseudomonas aeruginosa NOT DETECTED NOT DETECTED Final   Candida albicans NOT DETECTED NOT DETECTED Final   Candida glabrata NOT DETECTED NOT DETECTED Final   Candida  krusei NOT DETECTED NOT DETECTED Final   Candida parapsilosis NOT DETECTED NOT DETECTED Final   Candida tropicalis NOT DETECTED NOT DETECTED Final    Comment: Performed at [facility], [address], [place], [place] [address]  Blood Culture (routine x 2)     Status: None (Preliminary result)   Collection Time: 04/29/17  9:39 AM  Result Value Ref Range Status   Specimen Description BLOOD RIGHT FOREARM  Final   Special Requests   Final    BOTTLES DRAWN AEROBIC AND ANAEROBIC Blood Culture adequate volume   Culture   Final    NO GROWTH 2 DAYS Performed at [facility], [address] [place], [place] [address]    Report Status PENDING  Incomplete  Blood culture (routine x 2)     Status: Abnormal   Collection Time: 04/29/17 12:05 PM  Result Value Ref Range Status   Specimen Description BLOOD CENTRAL LINE PORTA CATH  Final   Special Requests   Final    BOTTLES DRAWN AEROBIC AND ANAEROBIC Blood Culture adequate volume   Culture  Setup Time   Final    GRAM NEGATIVE RODS IN BOTH AEROBIC AND ANAEROBIC BOTTLES CRITICAL VALUE NOTED.  VALUE IS CONSISTENT WITH PREVIOUSLY REPORTED AND CALLED VALUE.    Culture (A)  Final    KLEBSIELLA PNEUMONIAE SUSCEPTIBILITIES  PERFORMED ON PREVIOUS CULTURE WITHIN THE LAST 5 DAYS. Performed at [facility], [address], [place], [place] [address]    Report Status 05/02/2017 FINAL  Final  Blood culture (routine x 2)     Status: None (Preliminary result)   Collection Time: 04/29/17  4:52 PM  Result Value Ref Range Status   Specimen Description BLOOD RIGHT ANTECUBITAL  Final   Special Requests   Final    BOTTLES DRAWN AEROBIC ONLY Blood Culture adequate volume   Culture   Final    NO GROWTH 2 DAYS Performed at [facility], [address] [place], [place] [address]    Report Status PENDING  Incomplete  Culture, Urine     Status: None   Collection Time: 04/29/17  4:57 PM  Result Value Ref Range Status   Specimen Description URINE, CLEAN CATCH  Final   Special Requests NONE  Final   Culture   Final    NO GROWTH Performed at [facility], [address] [place], [place] [address]    Report Status 05/01/2017 FINAL  Final  Respiratory Panel by PCR     Status: None   Collection Time: 04/29/17  6:46 PM  Result Value Ref Range Status   Adenovirus NOT DETECTED NOT DETECTED Final   Coronavirus 229E NOT DETECTED NOT DETECTED Final   Coronavirus HKU1 NOT DETECTED NOT DETECTED Final   Coronavirus NL63 NOT DETECTED NOT DETECTED Final   Coronavirus OC43 NOT DETECTED NOT DETECTED Final   Metapneumovirus NOT DETECTED NOT DETECTED Final   Rhinovirus / Enterovirus NOT DETECTED NOT DETECTED Final   Influenza A NOT DETECTED NOT DETECTED Final   Influenza B NOT DETECTED NOT DETECTED Final   Parainfluenza Virus 1 NOT DETECTED NOT DETECTED Final   Parainfluenza Virus 2 NOT DETECTED NOT DETECTED Final   Parainfluenza Virus 3 NOT DETECTED NOT DETECTED Final   Parainfluenza Virus 4 NOT DETECTED NOT DETECTED Final   Respiratory Syncytial Virus NOT DETECTED NOT DETECTED Final   Bordetella pertussis NOT DETECTED NOT DETECTED Final   Chlamydophila pneumoniae NOT DETECTED NOT DETECTED Final  Mycoplasma  pneumoniae NOT DETECTED NOT DETECTED Final    Cliffton AstersJohn Megha Agnes, MD [facility] [phone] pager   [phone] cell 05/02/2017, 1:50 PM

## 2017-05-02 NOTE — Progress Notes (Signed)
Septra for discharge.

## 2017-05-02 NOTE — Progress Notes (Signed)
Discharge home. Home discharge instruction given, no question verbalized. Interpreter used for discharge. Meds given by MD.

## 2017-05-02 NOTE — Discharge Instructions (Signed)
Please continue to follow up with Infectious Disease. You will need to follow up with the family medicine clinic. Please take the medications prescribed to you by infectious disease according to the handout given.

## 2017-05-02 NOTE — Progress Notes (Signed)
Patient has been handed tedizolid and septra for use at discharge including medication calendar in french on how to take them. He knows to pick up next month supply of tedizolid from our office because he is getting it through patient assistance pharmacy.
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
## 2017-05-04 LAB — CULTURE, BLOOD (ROUTINE X 2)
CULTURE: NO GROWTH
Culture: NO GROWTH
SPECIAL REQUESTS: ADEQUATE
Special Requests: ADEQUATE

## 2017-05-06 ENCOUNTER — Other Ambulatory Visit: Payer: Self-pay

## 2017-05-06 ENCOUNTER — Encounter: Payer: Self-pay | Admitting: Internal Medicine

## 2017-05-06 ENCOUNTER — Ambulatory Visit (INDEPENDENT_AMBULATORY_CARE_PROVIDER_SITE_OTHER): Payer: Self-pay | Admitting: Internal Medicine

## 2017-05-06 VITALS — BP 104/70 | HR 89 | Temp 97.4°F | Ht 71.0 in | Wt 183.6 lb

## 2017-05-06 DIAGNOSIS — Z09 Encounter for follow-up examination after completed treatment for conditions other than malignant neoplasm: Secondary | ICD-10-CM

## 2017-05-06 LAB — CULTURE, BLOOD (ROUTINE X 2)
Culture: NO GROWTH
Culture: NO GROWTH
SPECIAL REQUESTS: ADEQUATE
SPECIAL REQUESTS: ADEQUATE

## 2017-05-06 NOTE — Progress Notes (Signed)
   Subjective:    Jason Maxwell - 32 y.o. male MRN [id number]  Date of birth: [birth year]  HPI  Jason Maxwell is here for hospital follow up. Patient was hospitalized from 3/26-3/29 for fever secondary to PICC line infection. Patient was on chronic IV antibiotics of teflaro as well as PO levaquin and PO rifampin after osteomyelitis infection 2/2 left arm injury when he [place] in [place]. Patient had surgery performed to left arm by Dr. Jena GaussHaddix on 2/6. Had insertion of antibiotic cement spacer and intramedullary nailing of his left humerus. During this recent hospitalization, blood cultures grew Klebsiella pneumoniae.  ID consulted and recommended switching abx to ceftazadime-avibactam, linezolid, and rifampin. After final blood cultures resulted patient was discontinued from ceftazadime-avibactam and given 1 month supply tedizolid and 4 day supply of trimethoprim sulfamethoxazole and instructed to continue rifampin. Orthopedics consulted and recommended outpatient follow up for left arm.   Today, patient reports that he is feeling well. Has no concerns today. He has been afebrile. Denies chest pain, SOB, nausea and vomiting. He completed the TMP-SMX this morning. Reports continued compliance with Tedizolid and Rifampin. Has ID follow up scheduled. Has orthopedics follow up scheduled.    -  reports that he quit smoking about 2 years ago. He has never used smokeless tobacco. - Review of Systems: Per HPI. - Past Medical History: Patient Active Problem List   Diagnosis Date Noted  . Bacteremia due to Klebsiella pneumoniae 04/30/2017  . PICC line infection 04/29/2017  . Microcytic anemia 04/29/2017  . AKI (acute kidney injury) (HCC)   . MRSA infection   . 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 Fracture   . Humerus fracture   . 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 Hospital discharge follow-up Follow up for hospitalization of bacteremia and osteomyelitis. Patient is afebrile with stable vital signs and well appearing. Has been compliant with antibiotics. Has appropriate follow up with ID and Ortho.    Marcy Sirenatherine Lakya Schrupp, D.O. 05/06/2017, 12:11 PM PGY-3, [facility]

## 2017-05-06 NOTE — Patient Instructions (Addendum)
I am glad to hear you are feeling well and that you are taking your medications. Please continue to take your antibiotics and keep the follow up appointments with infectious disease and with the surgeon.

## 2017-05-07 ENCOUNTER — Inpatient Hospital Stay: Payer: Self-pay | Admitting: Family Medicine

## 2017-05-08 ENCOUNTER — Ambulatory Visit (INDEPENDENT_AMBULATORY_CARE_PROVIDER_SITE_OTHER): Payer: Self-pay | Admitting: Pharmacist Clinician (PhC)/ Clinical Pharmacy Specialist

## 2017-05-08 DIAGNOSIS — M86422 Chronic osteomyelitis with draining sinus, left humerus: Secondary | ICD-10-CM

## 2017-05-08 LAB — CBC WITH DIFFERENTIAL/PLATELET
BASOS ABS: 32 {cells}/uL (ref 0–200)
Basophils Relative: 0.8 %
EOS ABS: 152 {cells}/uL (ref 15–500)
Eosinophils Relative: 3.8 %
HCT: 41.1 % (ref 38.5–50.0)
HEMOGLOBIN: 13.2 g/dL (ref 13.2–17.1)
Lymphs Abs: 2428 cells/uL (ref 850–3900)
MCH: 22 pg — AB (ref 27.0–33.0)
MCHC: 32.1 g/dL (ref 32.0–36.0)
MCV: 68.4 fL — AB (ref 80.0–100.0)
MONOS PCT: 20.9 %
MPV: 9.8 fL (ref 7.5–12.5)
NEUTROS ABS: 552 {cells}/uL — AB (ref 1500–7800)
Neutrophils Relative %: 13.8 %
Platelets: 375 10*3/uL (ref 140–400)
RBC: 6.01 10*6/uL — ABNORMAL HIGH (ref 4.20–5.80)
RDW: 18.8 % — ABNORMAL HIGH (ref 11.0–15.0)
Total Lymphocyte: 60.7 %
WBC: 4 10*3/uL (ref 3.8–10.8)
WBCMIX: 836 {cells}/uL (ref 200–950)

## 2017-05-08 LAB — CBC MORPHOLOGY

## 2017-05-08 NOTE — Progress Notes (Signed)
HPI: Jason Maxwell is a 32 y.o. male who is here for his post hospital visit with pharmacy.  Allergies: Allergies  Allergen Reactions  . Pork-Derived Products     Religious reasons     Vitals:    Past Medical History: No past medical history on file.  Social History: Social History   Socioeconomic History  . Marital status: Married    Spouse name: Not on file  . Number of children: Not on file  . Years of education: Not on file  . Highest education level: Not on file  Occupational History  . Not on file  Social Needs  . Financial resource strain: Not on file  . Food insecurity:    Worry: Not on file    Inability: Not on file  . Transportation needs:    Medical: Not on file    Non-medical: Not on file  Tobacco Use  . Smoking status: Former Smoker    Last attempt to quit: 03/12/2015    Years since quitting: 2.1  . Smokeless tobacco: Never Used  Substance and Sexual Activity  . Alcohol use: No  . Drug use: No  . Sexual activity: Not on file  Lifestyle  . Physical activity:    Days per week: Not on file    Minutes per session: Not on file  . Stress: Not on file  Relationships  . Social connections:    Talks on phone: Not on file    Gets together: Not on file    Attends religious service: Not on file    Active member of club or organization: Not on file    Attends meetings of clubs or organizations: Not on file    Relationship status: Not on file  Other Topics Concern  . Not on file  Social History Narrative  . Not on file    Previous Regimen: Ceftaroline/Levaquin/Rifampin  Current Regimen: Tedizolid/rifampin  Labs: Hepatitis B Surface Ag (no units)  Date Value  03/13/2017 Negative    CrCl: Estimated Creatinine Clearance: 107.5 mL/min (by C-G formula based on SCr of 1.06 mg/dL).  Lipids: No results found for: CHOL, TRIG, HDL, CHOLHDL, VLDL, LDLCALC  Assessment: Jason Maxwell is here today to see pharmacy for his post hospitalization for PICC  line infection. He completed his 4 days of post discharge bactrim for the bacteremia. He remained afebrile and doing well. He is here with a family friend for translation help. We will get a CBC today to check for his platelets. He has an up coming visit with Dr. Daiva EvesVan Dam on 4/24. I have coordinated the patient assistance pharmacy for tedizolid to ship it here prior to his visit so he can pick it up here at that time. Dr. Daiva EvesVan Dam will determine the length of therapy for his humerus osteomyelitis.   Recommendations:  Continue Tedizolid/rifampin  CBC today F/u with Dr. Daiva EvesVan Dam later this month  Jason Maxwell, PharmD, BCPS, AAHIVP, CPP Clinical Infectious Disease Pharmacist [facility] 05/08/2017, 12:22 PM

## 2017-05-28 ENCOUNTER — Ambulatory Visit: Payer: Self-pay | Admitting: Infectious Disease

## 2017-05-29 ENCOUNTER — Encounter: Payer: Self-pay | Admitting: *Deleted

## 2017-05-29 ENCOUNTER — Encounter: Payer: Self-pay | Admitting: Infectious Disease

## 2017-05-29 ENCOUNTER — Ambulatory Visit (INDEPENDENT_AMBULATORY_CARE_PROVIDER_SITE_OTHER): Payer: Self-pay | Admitting: Infectious Disease

## 2017-05-29 ENCOUNTER — Ambulatory Visit: Payer: Self-pay | Attending: Student | Admitting: *Deleted

## 2017-05-29 VITALS — BP 124/81 | HR 82 | Temp 98.2°F

## 2017-05-29 DIAGNOSIS — R7881 Bacteremia: Secondary | ICD-10-CM

## 2017-05-29 DIAGNOSIS — T80219D Unspecified infection due to central venous catheter, subsequent encounter: Secondary | ICD-10-CM

## 2017-05-29 DIAGNOSIS — D709 Neutropenia, unspecified: Secondary | ICD-10-CM

## 2017-05-29 DIAGNOSIS — B961 Klebsiella pneumoniae [K. pneumoniae] as the cause of diseases classified elsewhere: Secondary | ICD-10-CM

## 2017-05-29 DIAGNOSIS — M6281 Muscle weakness (generalized): Secondary | ICD-10-CM | POA: Insufficient documentation

## 2017-05-29 DIAGNOSIS — R208 Other disturbances of skin sensation: Secondary | ICD-10-CM | POA: Insufficient documentation

## 2017-05-29 DIAGNOSIS — M86322 Chronic multifocal osteomyelitis, left humerus: Secondary | ICD-10-CM

## 2017-05-29 DIAGNOSIS — D708 Other neutropenia: Secondary | ICD-10-CM

## 2017-05-29 DIAGNOSIS — R278 Other lack of coordination: Secondary | ICD-10-CM | POA: Insufficient documentation

## 2017-05-29 DIAGNOSIS — A4902 Methicillin resistant Staphylococcus aureus infection, unspecified site: Secondary | ICD-10-CM

## 2017-05-29 HISTORY — DX: Neutropenia, unspecified: D70.9

## 2017-05-29 LAB — COMPLETE METABOLIC PANEL WITH GFR
AG Ratio: 1.7 (calc) (ref 1.0–2.5)
ALT: 27 U/L (ref 9–46)
AST: 24 U/L (ref 10–40)
Albumin: 4.7 g/dL (ref 3.6–5.1)
Alkaline phosphatase (APISO): 99 U/L (ref 40–115)
BILIRUBIN TOTAL: 0.5 mg/dL (ref 0.2–1.2)
BUN: 12 mg/dL (ref 7–25)
CALCIUM: 9.9 mg/dL (ref 8.6–10.3)
CHLORIDE: 103 mmol/L (ref 98–110)
CO2: 27 mmol/L (ref 20–32)
Creat: 1.24 mg/dL (ref 0.60–1.35)
GFR, EST AFRICAN AMERICAN: 89 mL/min/{1.73_m2} (ref >=60)
GFR, EST NON AFRICAN AMERICAN: 77 mL/min/{1.73_m2} (ref >=60)
GLUCOSE: 106 mg/dL — AB (ref 65–99)
Globulin: 2.8 g/dL (calc) (ref 1.9–3.7)
Potassium: 4 mmol/L (ref 3.5–5.3)
Sodium: 139 mmol/L (ref 135–146)
TOTAL PROTEIN: 7.5 g/dL (ref 6.1–8.1)

## 2017-05-29 LAB — SEDIMENTATION RATE: Sed Rate: 2 mm/h (ref 0–15)

## 2017-05-29 NOTE — Patient Instructions (Addendum)
Your Splint This splint should initially be fitted by a healthcare practitioner.  The healthcare practitioner is responsible for providing wearing instructions and precautions to the patient, other healthcare practitioners and care provider involved in the patient's care.  This splint was custom made for you. Please read the following instructions to learn about wearing and caring for your splint.  Precautions Should your splint cause any of the following problems, remove the splint immediately and contact your therapist/physician.  Swelling  Severe Pain  Pressure Areas  Stiffness  Numbness   When To Wear Your Splint Where your splint according to your therapist/physician instructions. At night, however, you will need to gradually increase the wearing time of this splint. Start wearing in 15-20 Min intervals. Inspect your hand/wrist for any pressure areas and if you do not see any, you can wear it for longer the next time. If you DO notice any pressure areas, remove it, call our office and bring it to your next appointment for adjustment.  Care and Cleaning of Your Splint 1. Keep your splint away from open flames. 2. Your splint will lose its shape in temperatures over 135 degrees Farenheit, ( in car windows, near radiators, ovens or in hot water).  Never make any adjustments to your splint, if the splint needs adjusting remove it and make an appointment to see your therapist. 3. It may be cleaned with soap and lukewarm water or rubbing alcohol.  Do not immerse in hot water over 135 degrees Farenheit. 4. Straps may be washed with soap and water, but do not moisten the self-adhesive portion.   You were also issued a pre-fabricated wrist cock-up splint for day time use on your left wrist. This splint places your left wrist in a better, neutral or straight position instead of hanging/flexing forward.

## 2017-05-29 NOTE — Progress Notes (Signed)
Subjective:   Chief complaint: arm pain   Patient ID: Jason Maxwell, male    DOB: [birth year], 32 y.o.   MRN: [id number]  HPI  32 y.o. male with with chronic osteomyelitis associated with hardware with draining sinuses x 6 months while in [place], moved to [place] ultimately has had Disphysectomy of humerus for osteomyelitis , Intramedullary nailing of left humerus, Insertion of antibiotic cement spacer.   Unfortunately all of his infected bone and tissue that was meticulously removed was sent to pathology ALONE and placed in formalin. Therefore we could never get a culture to guide therapy.  We do have a clue to a potential organism based on a MDR MRSA that grew from culture from sinus in October, 2018.  We covered him with IV vancomcyin, Ceftriaxone and oral rifampin in the interim. We ran into issues with vancomycin and had to change him to Teflaro 600mg  IV q 12 hours and added in levaquin orally with continued oral rifampin.  He has had very nice improvement in his arm which he is able to swing around now. He said he can even participate in some sports which he could not previously do due to degree of pain in his arm. He has seen Dr. Jena Gauss in followup who has liberated him from his sling.  In the interim he had Klebsiella GNR bacteremia associated with PICC which was removed and treated. He has   changed over to tedizolied but rifampin dropped off after not having been refilled. He had leukopenia and Neutropenia at last blood draw but we were not able to check blood work or get in touch with him until he came to appt today.  He feels much improved with some burning in skin at times and more deep pain with stressing the joint of shoulder by reaching over his head. Dr. Jena Gauss saw the pt on Monday and was VERY happy with his progress and appearance of arm.   No past medical history on file.  Past Surgical History:  Procedure Laterality Date  . arm surgery Left   . FRACTURE SURGERY    .  HERNIA REPAIR Right    inguinal   . HUMERUS IM NAIL Left 03/12/2017   Procedure: INTRAMEDULLARY (IM) NAIL LEFT HUMERAL;  Surgeon: Roby Lofts, MD;  Location: [facility] OR;  Service: Orthopedics;  Laterality: Left;  . IM NAILING HUMERUS Left 03/12/2017  . INCISION AND DRAINAGE OF WOUND Left 03/12/2017   Procedure: IRRIGATION AND DEBRIDEMENT LEFT HUMEROUS WOUND;  Surgeon: Roby Lofts, MD;  Location: [facility] OR;  Service: Orthopedics;  Laterality: Left;    No family history on file.    Social History   Socioeconomic History  . Marital status: Married    Spouse name: Not on file  . Number of children: Not on file  . Years of education: Not on file  . Highest education level: Not on file  Occupational History  . Not on file  Social Needs  . Financial resource strain: Not on file  . Food insecurity:    Worry: Not on file    Inability: Not on file  . Transportation needs:    Medical: Not on file    Non-medical: Not on file  Tobacco Use  . Smoking status: Former Smoker    Last attempt to quit: 03/12/2015    Years since quitting: 2.2  . Smokeless tobacco: Never Used  Substance and Sexual Activity  . Alcohol use: No  . Drug use: No  . Sexual  activity: Not on file  Lifestyle  . Physical activity:    Days per week: Not on file    Minutes per session: Not on file  . Stress: Not on file  Relationships  . Social connections:    Talks on phone: Not on file    Gets together: Not on file    Attends religious service: Not on file    Active member of club or organization: Not on file    Attends meetings of clubs or organizations: Not on file    Relationship status: Not on file  Other Topics Concern  . Not on file  Social History Narrative  . Not on file    Allergies  Allergen Reactions  . Pork-Derived Products     Religious reasons      Current Outpatient Medications:  .  ondansetron (ZOFRAN) 4 MG tablet, Take 1 tablet (4 mg total) by mouth every 8 (eight) hours as needed for  nausea or vomiting., Disp: 10 tablet, Rfl: 0 .  acetaminophen (TYLENOL) 500 MG tablet, Take 1 tablet (500 mg total) by mouth every 6 (six) hours as needed. (Patient not taking: Reported on 05/29/2017), Disp: 30 tablet, Rfl: 0 .  polyethylene glycol (MIRALAX / GLYCOLAX) packet, Take 17 g by mouth daily as needed for mild constipation. (Patient not taking: Reported on 05/29/2017), Disp: 14 each, Rfl: 0 .  Tedizolid Phosphate 200 MG TABS, Take 200 mg by mouth daily. (Patient not taking: Reported on 05/29/2017), Disp: 30 tablet, Rfl: 0     Review of Systems  Constitutional: Negative for chills and fever.  HENT: Negative for congestion and sore throat.   Eyes: Negative for photophobia.  Respiratory: Negative for cough, shortness of breath and wheezing.   Cardiovascular: Negative for chest pain, palpitations and leg swelling.  Gastrointestinal: Negative for abdominal pain, blood in stool, constipation, diarrhea, nausea and vomiting.  Genitourinary: Negative for dysuria, flank pain and hematuria.  Musculoskeletal: Positive for myalgias. Negative for back pain.  Skin: Positive for wound. Negative for rash.  Neurological: Positive for weakness. Negative for dizziness and headaches.  Hematological: Does not bruise/bleed easily.  Psychiatric/Behavioral: Negative for suicidal ideas.       Objective:   Physical Exam  Constitutional: He is oriented to person, place, and time. He appears well-developed and well-nourished. No distress.  HENT:  Head: Normocephalic and atraumatic.  Mouth/Throat: No oropharyngeal exudate.  Eyes: Conjunctivae and EOM are normal. No scleral icterus.  Neck: Normal range of motion. Neck supple.  Cardiovascular: Normal rate and regular rhythm.  Pulmonary/Chest: Effort normal. No respiratory distress. He has no wheezes.  Abdominal: He exhibits no distension.  Musculoskeletal: He exhibits no edema or tenderness.  Neurological: He is alert and oriented to person, place, and  time. He exhibits normal muscle tone. Coordination normal.  Skin: Skin is warm and dry. He is not diaphoretic.  Psychiatric: He has a normal mood and affect. His behavior is normal. Judgment and thought content normal.   He can swing his left arm but cannot use fingers in hand to grip anything  He has chronic skin changes related to his infection.  05/29/17:          Assessment & Plan:   Chronic osteomyelitis with draining sinuses and hardware sp removal of much of hardware, debridement IM nailing of humerus  Concern is for MDR MRSA as culprit organism  H  Concern that he may have Neutropenia from teizolid.  Will have him stop this and check CBC c  diff today with routine labs.  If he is still neutropenic change to clindamycin with need for pharma assistance of some type or charity. Also then add back rifampin if possible  Neutropenia: see above discussion  Gram negative PICC line associated bacteremia: resolved  We spent greater than 25 minutes with the patient including greater than 50% of time in face to face counsel of the patient re his infection, neutropenia via [place] translator explaining labs and next steps and in coordination of their care.

## 2017-05-29 NOTE — Patient Instructions (Signed)
N nous allons prendre sangre aujourd'hui pour vrifier vos globules blancs qui taient bas avant  Arrtez votre antibotique et attendez untile, nous vous appelons et dcidons s'il est sr de Company secretaryle redmarrer ou si nous Social workerdevons changer de traitement avec un nouvel antibiotique.

## 2017-05-29 NOTE — Therapy (Signed)
[facility] [address] [place], [place], [address] Phone: [phone]   Fax:  [phone]  Occupational Therapy Evaluation  Patient Details  Name: Cha Gomillion MRN: [id number] Date of Birth: 1985-07-12 Referring Provider: Dr Caryn Bee P.Haddix   Encounter Date: 05/29/2017  OT End of Session - 05/29/17 1240    Visit Number  1    Number of Visits  8    Date for OT Re-Evaluation  07/24/17    Authorization Type  Med Pay Assurance    OT Start Time  0757    OT Stop Time  0914    OT Time Calculation (min)  77 min    Activity Tolerance  Patient tolerated treatment well    Behavior During Therapy  [facility] for tasks assessed/performed       History reviewed. No pertinent past medical history.  Past Surgical History:  Procedure Laterality Date  . arm surgery Left   . FRACTURE SURGERY    . HERNIA REPAIR Right    inguinal   . HUMERUS IM NAIL Left 03/12/2017   Procedure: INTRAMEDULLARY (IM) NAIL LEFT HUMERAL;  Surgeon: Roby Lofts, MD;  Location: [facility] OR;  Service: Orthopedics;  Laterality: Left;  . IM NAILING HUMERUS Left 03/12/2017  . INCISION AND DRAINAGE OF WOUND Left 03/12/2017   Procedure: IRRIGATION AND DEBRIDEMENT LEFT HUMEROUS WOUND;  Surgeon: Roby Lofts, MD;  Location: [facility] OR;  Service: Orthopedics;  Laterality: Left;    There were no vitals filed for this visit.  Subjective Assessment - 05/29/17 0803    Subjective   Pt presents for splint for L hand. Pt has f/u with Dr Jena Gauss on 06/25/17.    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    Currently in Pain?  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   Yes    Is the patient reluctant to leave their home because of a fear of falling?   No      Home  Environment   Lives With  Family      Prior Function   Level of Independence  Independent with basic ADLs;Independent    Vocation  Unemployed      ADL   ADL comments  Mod I all ADL's; Using R dominant  hand Pt/spouse report no functinal use LUE since injury in 2017      Mobility   Mobility Status  Independent      Written Expression   Dominant Hand  Right      Observation/Other Assessments   Observations  Pt LUE is flaccid w/ no A/Rom and limited P/ROM to elbow, wrist and hand since injury/nerve palsy in 2017.    Skin Integrity  Pt has well healed scars over LUE from humerus extending distal to wrist/hand noted. Also noted significant muscle atrophy      Sensation   Light Touch  Impaired Detail Absent     Hot/Cold  Impaired Detail    Additional Comments  Pt has significant impairment to LUE w/ absent light and protective sensation noted.      Coordination   Gross Motor Movements are Fluid and Coordinated  No    Fine Motor Movements are Fluid and Coordinated  No    Other  Pt is absent of A/ROM throught out L UE w/ limited P/ROM      ROM / Strength   AROM / PROM / Strength  AROM;PROM      AROM   Overall AROM    Deficits    Overall AROM Comments  NO A/ROM; Absent since injury in 2017      PROM   Overall PROM   Deficits Limited P/ROM L UE noted. Including tenodesis type movement      Hand Function   Right Hand Gross Grasp  Impaired Zero/absent; Limited tenodesis type movement/contractures               OT Treatments/Exercises (OP) - 05/29/17 0001      Splinting   Splinting  Pt was present with his wife and interpreter: Mo Sow ([place])  throughout this OT Assessment today. Pt presents today per Dr. Gillie Manners. Haddix  for OT Eval/treat "Dx LUE Nerve palsy & contracture, for LUE splint for contracture" This OT called Dr. Luvenia Starch office for clarification of orders and splinting that MD would like fabricated for Pt. Gwen from Dr [employer] office called back and per telephone order/discussion stated that "Dr Jena Gauss wants a splint to hold the wrist and hand in neutral position" Therefore, pt was fitted with a custom fabricated noc resting splint that places his L wrist in neutral and digits in slightly flexed position (secondary to contractures and linjury that occurred in 2017 per pt/spouse reports). Extra padding was added to L small finger as this finger was noted to flex more than the others when wrist was placed in neutral. Pt/spouse were educated in splinting use, care and precautions via interpreter and handout that was issued today in clinic. Pt will gradually increase wearing time during the day, while monitiring his skin for any signs of pressure areas & eventually wear it at noc. He was then fitted with and issued a pre-fabricated wrist cock-up splint for day time use, that places his left wrist in neutral position and leaves his fingers free. Pt/spouse verbalized understanding of all of this as expressed through the interpreter in clinic today.        Your Splint This splint should initially be fitted by a healthcare practitioner.  The healthcare practitioner is responsible for providing  wearing instructions and precautions to the patient, other healthcare practitioners and care provider involved in the patient's care.  This splint was custom made for you. Please read the following instructions to learn about wearing and caring for your splint.  Precautions Should your splint cause any of the following problems, remove the splint immediately and contact your therapist/physician.  Swelling  Severe Pain  Pressure Areas  Stiffness  Numbness   When To Wear Your Splint Where your splint according to your therapist/physician instructions. At night, however, you will need to gradually increase the wearing time of this splint. Start wearing in 15-20 Min intervals. Inspect your hand/wrist for any pressure areas and if you do not see any, you can wear it for longer the next time. If you DO notice any pressure areas, remove it, call our office and bring it to your next appointment for adjustment.  Care and Cleaning of Your Splint 1. Keep your splint away from open flames. 2. Your splint will lose its shape in temperatures over 135 degrees Farenheit, ( in car windows, near radiators, ovens or in hot water).  Never make any adjustments to your splint, if the splint needs adjusting remove it and make an appointment to see your therapist. 3. It may be cleaned with soap and lukewarm water or rubbing alcohol.  Do not immerse in hot water over 135 degrees Farenheit. 4. Straps may be washed with soap and water, but do not moisten the self-adhesive portion.   You were also issued a pre-fabricated wrist cock-up splint for day time use on your left wrist. This splint places your left wrist in a better, neutral or straight position instead of hanging/flexing forward.      OT Education - 05/29/17 1237    Education provided  Yes    Education Details  Left Custom resting hand splint (Noc time) and prefabricated splint (day time) use, care and precautions.    Person(s) Educated   Patient;Spouse;Other (comment) Interpreter- Mo Sow   Interpreter- Mo Sow   Methods  Explanation;Demonstration;Verbal cues;Handout    Comprehension  Verbalized understanding;Returned demonstration       OT Short Term Goals - 05/29/17 1308      OT SHORT TERM GOAL #1   Title  Pt will be Mod I splint wear and care L UE noc/day time splinting    Time  6    Period  Weeks    Status  New    Target Date  07/10/17      OT SHORT TERM GOAL #2   Title  Pt will be Mod I precautions related to absent sensation LUE for safety w/ daily activities.    Time  6    Period  Weeks    Status  New    Target Date  07/10/17        OT Long Term Goals - 05/29/17 1310      OT LONG TERM GOAL #1   Title  Pt will be Mod I upgraded splinting use, care and precautions left UE    Time  12    Period  Weeks    Status  New    Target Date  08/21/17      OT LONG TERM GOAL #2   Title  Pt will be I home program L UE     Time  12    Period  Weeks    Status  New    Target Date  08/21/17            Plan - 05/29/17 1242    Clinical Impression Statement  Pt is a plesant 32 y/o RHD male whom sustained a closed displaced transverse Left humeral fracture with non-union on 10/09/2015 (per pt & spouse  reports, however medical chart review/MD notes state this occurred 12/05/2016), his PMH includes Bacteremia due to Klebisella pneumoniae, PICC line infection, osteomyelitis left humerus, Microcytic anemia, AKI, MRSA. Pt presents for custom splinting L UE. Clarification of Dr. Luvenia StarchHaddix's orders as per telephone discussion with Dedra SkeensGwen in MD office today, states that MD requests "A splint to hold his hand and wrist in neutral position" secondary to Radial Nerve Palsy. Pt was noted to have no A/ROM & little P/ROM for tenodesis type movement to his wrist and digits LUE and stated that he has not "Since 10/09/2015" and his wife confirmed this via interpreter in clinic today. He also has absent sensibility in L Hand/UE. Pt was fitted  with a custom Noc resting hand splint for better positionng of his L non-dominant hand after getting clairfication of orders, and was educated in splint use, care and precautions. He was then fitted with a pre-fab wirist cock-up splint L and instructed to use this during the day as this splint places his wrist in neutral position. Pt should benefit from out-pt OT for splinting modifications for radial nerve palsy.    Occupational Profile and client history currently impacting functional performance  Pt/wife state injury occurred 10/09/2015 however chart review/MD note indicates that injury occurred 12/05/2016. Pt states he has not had any A/ROM since injury and is with limited P/ROM as well.    Occupational performance deficits (Please refer to evaluation for details):  ADL's;IADL's;Social Participation    Rehab Potential  Fair    Current Impairments/barriers affecting progress:  Pt reports no A/ROM or functional use L UE since 2017 (chart reports 12/05/2016) and limited P/ROM. LUE is flaccid.    OT Frequency  Other (comment) Eval plus up to 8 visits over next 12 weeks depending on pt progress for wrist positioning to avoid injury.    OT Duration  12 weeks    OT Treatment/Interventions  Patient/family education;Splinting;Therapeutic activities;Passive range of motion;Manual Therapy    Plan  Noc resting splint check and adjustments left.     Clinical Decision Making  Limited treatment options, no task modification necessary    Consulted and Agree with Plan of Care  Patient;Family member/caregiver;Other (Comment)    Family Member Consulted  Wife and interpreter.       Patient will benefit from skilled therapeutic intervention in order to improve the following deficits and impairments:  Impaired flexibility, Decreased coordination, Impaired sensation, Decreased strength, Decreased range of motion, Impaired UE functional use  Visit Diagnosis: Other lack of coordination - Plan: Ot plan of care  cert/re-cert  Other disturbances of skin sensation - Plan: Ot plan of care cert/re-cert  Muscle weakness (generalized) - Plan: Ot plan of care cert/re-cert    Problem List Patient Active Problem List   Diagnosis Date Noted  . Bacteremia due to Klebsiella pneumoniae 04/30/2017  . PICC line infection 04/29/2017  . Microcytic anemia 04/29/2017  . AKI (acute kidney injury) (HCC)   . MRSA infection   . 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 Fracture   . Humerus fracture   . 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

## 2017-05-30 LAB — CBC WITH DIFFERENTIAL/PLATELET
Basophils Absolute: 60 cells/uL (ref 0–200)
Basophils Relative: 1.2 %
EOS PCT: 4.6 %
Eosinophils Absolute: 230 cells/uL (ref 15–500)
HEMATOCRIT: 41.2 % (ref 38.5–50.0)
Hemoglobin: 13 g/dL — ABNORMAL LOW (ref 13.2–17.1)
LYMPHS ABS: 2690 {cells}/uL (ref 850–3900)
MCH: 21.6 pg — ABNORMAL LOW (ref 27.0–33.0)
MCHC: 31.6 g/dL — AB (ref 32.0–36.0)
MCV: 68.3 fL — ABNORMAL LOW (ref 80.0–100.0)
MONOS PCT: 15.1 %
NEUTROS PCT: 25.3 %
Neutro Abs: 1265 cells/uL — ABNORMAL LOW (ref 1500–7800)
PLATELETS: 148 10*3/uL (ref 140–400)
RBC: 6.03 10*6/uL — AB (ref 4.20–5.80)
RDW: 18.2 % — ABNORMAL HIGH (ref 11.0–15.0)
Total Lymphocyte: 53.8 %
WBC mixed population: 755 cells/uL (ref 200–950)
WBC: 5 10*3/uL (ref 3.8–10.8)

## 2017-05-30 LAB — C-REACTIVE PROTEIN: CRP: 0.9 mg/L (ref <8.0)

## 2017-06-04 ENCOUNTER — Ambulatory Visit: Payer: Self-pay | Attending: Student | Admitting: *Deleted

## 2017-06-04 ENCOUNTER — Encounter: Payer: Self-pay | Admitting: *Deleted

## 2017-06-04 DIAGNOSIS — R208 Other disturbances of skin sensation: Secondary | ICD-10-CM | POA: Insufficient documentation

## 2017-06-04 DIAGNOSIS — R278 Other lack of coordination: Secondary | ICD-10-CM | POA: Insufficient documentation

## 2017-06-04 DIAGNOSIS — M6281 Muscle weakness (generalized): Secondary | ICD-10-CM | POA: Insufficient documentation

## 2017-06-04 NOTE — Therapy (Signed)
[facility] [address] [place], [place], [address] Phone: [phone]   Fax:  [phone]  Occupational Therapy Treatment  Patient Details  Name: Jason Maxwell MRN: [id number] Date of Birth: [birth year] Referring Provider: Dr Caryn Bee P.Haddix   Encounter Date: 06/04/2017  OT End of Session - 06/04/17 1206    Visit Number  2    Number of Visits  8    Date for OT Re-Evaluation  07/24/17    Authorization Type  Med Pay Assurance    OT Start Time  [phone]    OT Stop Time  1004    OT Time Calculation (min)  37 min    Activity Tolerance  Patient tolerated treatment well    Behavior During Therapy  [facility] for tasks assessed/performed       Past Medical History:  Diagnosis Date  . Neutropenia (HCC) 05/29/2017    Past Surgical History:  Procedure Laterality Date  . arm surgery Left   . FRACTURE SURGERY    . HERNIA REPAIR Right    inguinal   . HUMERUS IM NAIL Left 03/12/2017   Procedure: INTRAMEDULLARY (IM) NAIL LEFT HUMERAL;  Surgeon: Roby Lofts, MD;  Location: [facility] OR;  Service: Orthopedics;  Laterality: Left;  . IM NAILING HUMERUS Left 03/12/2017  . INCISION AND DRAINAGE OF WOUND Left 03/12/2017   Procedure: IRRIGATION AND DEBRIDEMENT LEFT HUMEROUS WOUND;  Surgeon: Roby Lofts, MD;  Location: [facility] OR;  Service: Orthopedics;  Laterality: Left;    There were no vitals filed for this visit.  Subjective Assessment - 06/04/17 0932    Subjective   Pt presents weraring L resting hand splint and states that he has some "nerve pain" in left forearm/hand.    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    Currently in Pain?  No/denies    Pain Score  0-No pain    Multiple Pain Sites  No                   OT Treatments/Exercises (OP) - 06/04/17 0001      Splinting   Splinting  Pt presents to clinic today wearing resting hand splint LUE. He states vis interpreter, Mo Sow [facility]) that the splint is fitting well and that alternates between resting hand splint and pre-fab wrist cock-up splint left. After a discussion in clinic today, pt states that he feels as though the splint can be adjusted to assist with increasing left finger extension. 2 additiomal layers of splint padding was added to base of splint to allow for increased finger extension and pt was instructed to wear one splint or the other at noc and during the day to which he verbalized understanding. Pt was also instructed in gentle passive finger extension while maintaining neutral wrist as able at home. Again he verbalized understanding and demonstrated in clinc. [place] and straps were provided and an additional strap was added to distal fingertips to allow for better positioning within the splint.       Manual Therapy   Manual Therapy  Joint mobilization;Passive ROM    Manual therapy comments  Gentle finger extension while maintaining neutral wrist prior to  splint adjustments today left hand/wrist x10 min             OT Education - 06/04/17 1014    Education provided  Yes    Education Details  Review of splinting use, care and precautions left    Person(s) Educated  Patient;Other (comment) Interpreter - Mo Sow   Interpreter - Mo Sow   Methods  Explanation;Demonstration    Comprehension  Verbalized understanding;Returned demonstration       OT Short Term Goals - 05/29/17 1308      OT SHORT TERM GOAL #1   Title  Pt will be Mod I splint wear and care L UE noc/day time splinting    Time  6    Period  Weeks    Status  New    Target Date  07/10/17      OT SHORT  TERM GOAL #2   Title  Pt will be Mod I precautions related to absent sensation LUE for safety w/ daily activities.    Time  6    Period  Weeks    Status  New    Target Date  07/10/17        OT Long Term Goals - 05/29/17 1310      OT LONG TERM GOAL #1   Title  Pt will be Mod I upgraded splinting use, care and precautions left UE    Time  12    Period  Weeks    Status  New    Target Date  08/21/17      OT LONG TERM GOAL #2   Title  Pt will be I home program L UE     Time  12    Period  Weeks    Status  New    Target Date  08/21/17            Plan - 06/04/17 1206    Clinical Impression Statement  L resting hand splint check and adjustment today to assist with increased left hand/finger extension while in splint. Pt reports Mod I use of both splints via interpreter. Extra padding and straps at distal fingers should allow for increased finger extension when wearing.    Occupational performance deficits (Please refer to evaluation for details):  ADL's;IADL's;Social Participation    Rehab Potential  Fair    Current Impairments/barriers affecting progress:  Pt reports no A/ROM or functional use L UE since 2017 (chart reports 12/05/2016) and limited P/ROM. LUE is flaccid.    OT Frequency  Other (comment) Eval + up to 8 additional visits over next 12 weeks depending on pt progress, for wrist positioning to avoid injury.    OT Duration  12 weeks    OT Treatment/Interventions  Patient/family education;Splinting;Therapeutic activities;Passive range of motion;Manual Therapy    Plan  L resting hand splint check and adjustment PRN, gentle stretch/passive extension of digits w/ neutral wrist (as able) prior to splint adjustments.    Clinical Decision Making  Limited treatment options, no task modification necessary    Consulted and Agree with Plan of Care  Patient;Family member/caregiver;Other (Comment)    Family Member Consulted  Family and Interpreter (Mo Sow from [place])        Patient will benefit from skilled therapeutic intervention in order to improve the following deficits and impairments:  Impaired flexibility, Decreased coordination, Impaired sensation, Decreased strength, Decreased range of motion, Impaired UE functional use  Visit Diagnosis: Other lack of coordination  Other disturbances of skin sensation  Muscle weakness (generalized)    Problem List Patient Active Problem List   Diagnosis Date Noted  . Neutropenia (HCC) 05/29/2017  . Bacteremia due to Klebsiella pneumoniae 04/30/2017  . PICC line infection 04/29/2017  . Microcytic anemia 04/29/2017  . AKI (acute kidney injury) (HCC)   . MRSA infection   . 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 Fracture   . Humerus fracture   . 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 [place], [place], [address] Phone: [phone]   Fax:  [phone]  Name: Jason Maxwell MRN: [id number] Date of Birth: 14-Jul-1985

## 2017-06-06 ENCOUNTER — Telehealth: Payer: Self-pay | Admitting: Behavioral Health

## 2017-06-06 NOTE — Telephone Encounter (Signed)
He needs TO RESUME His antibiotic (The one that [place] gave him) [employer]

## 2017-06-06 NOTE — Telephone Encounter (Signed)
Patient called and used friend Lina Sar to help translate.  Patient states at his last visit he had lab work done which would determine if he needed to stop his Tedizolid.  Lab work is back.  Informed Mr. Jason Maxwell of a return call as soon as an answer is given. Angeline Slim RN

## 2017-06-10 NOTE — Telephone Encounter (Signed)
Thanks so much. 

## 2017-06-10 NOTE — Telephone Encounter (Signed)
Called Jason Maxwell, informed him per Dr. Daiva Eves he is to Resume his Tedzolid.  Patient verbalized understanding and verified pharmacy appointment 06/12/2017 at 2:00pm [facility] RN

## 2017-06-12 ENCOUNTER — Ambulatory Visit: Payer: Self-pay | Admitting: Occupational Therapy

## 2017-06-12 ENCOUNTER — Ambulatory Visit (INDEPENDENT_AMBULATORY_CARE_PROVIDER_SITE_OTHER): Payer: Self-pay | Admitting: Pharmacist Clinician (PhC)/ Clinical Pharmacy Specialist

## 2017-06-12 DIAGNOSIS — R278 Other lack of coordination: Secondary | ICD-10-CM

## 2017-06-12 DIAGNOSIS — M86322 Chronic multifocal osteomyelitis, left humerus: Secondary | ICD-10-CM

## 2017-06-12 DIAGNOSIS — M6281 Muscle weakness (generalized): Secondary | ICD-10-CM

## 2017-06-12 MED ORDER — RIFAMPIN 300 MG PO CAPS: 300.0000 mg | ORAL_CAPSULE | Freq: Two times a day (BID) | ORAL | 1 refills | Status: DC

## 2017-06-12 MED FILL — rifAMPin 300 MG CAPS: 300 | 30 days supply | Qty: 60 | Fill #0

## 2017-06-12 NOTE — Patient Instructions (Signed)
  Flexion (Assistive)    Hold Lt wrist with Rt hand and raise arms above head, keeping elbows as straight as possible. Can be done sitting or lying. Sit up tall, go slow.  Repeat _10___ times. Do __2__ times per day.  .  Flexion (Passive)    Sitting upright, slide forearm forward along table, BOTH hands. Hold _3___ seconds. Repeat __10__ times. Do __2__ sessions per day.  SELF ASSISTED: Elbow Flexion    Clasp hands together, bend both elbows. _5__ reps per set, hold 20 sec., __2_ sets per day   Supination (Passive)    Keep elbow bent at right angle and held firmly at side. Use other hand to turn forearm until palm faces upward. Hold __20__ seconds. Repeat _5___ times. Do _2___ sessions per day.  Extension (Passive)    Using other hand, lift hand at wrist as far as possible. Hold __20__ seconds. Repeat __5__ times. Do _2___ sessions per day.  CAREGIVER ASSISTED: Straighten Fingers    Place arm on table. Caregiver straightens fingers. Patient looks at hand. Hold 20___ seconds. __5_ reps per set, __2_ sets per day   Copyright  VHI. All rights reserved.

## 2017-06-12 NOTE — Progress Notes (Signed)
HPI: Jason Maxwell is a 32 y.o. male who is here for his f/u with pharmacy for his lab monitoring.   Allergies: Allergies  Allergen Reactions  . Pork-Derived Products     Religious reasons     Vitals:    Past Medical History: Past Medical History:  Diagnosis Date  . Neutropenia (HCC) 05/29/2017    Social History: Social History   Socioeconomic History  . Marital status: Married    Spouse name: Not on file  . Number of children: Not on file  . Years of education: Not on file  . Highest education level: Not on file  Occupational History  . Not on file  Social Needs  . Financial resource strain: Not on file  . Food insecurity:    Worry: Not on file    Inability: Not on file  . Transportation needs:    Medical: Not on file    Non-medical: Not on file  Tobacco Use  . Smoking status: Former Smoker    Last attempt to quit: 03/12/2015    Years since quitting: 2.2  . Smokeless tobacco: Never Used  Substance and Sexual Activity  . Alcohol use: No  . Drug use: No  . Sexual activity: Not on file  Lifestyle  . Physical activity:    Days per week: Not on file    Minutes per session: Not on file  . Stress: Not on file  Relationships  . Social connections:    Talks on phone: Not on file    Gets together: Not on file    Attends religious service: Not on file    Active member of club or organization: Not on file    Attends meetings of clubs or organizations: Not on file    Relationship status: Not on file  Other Topics Concern  . Not on file  Social History Narrative  . Not on file    Previous Regimen: Vanc/ceftriaxone/rifampin>>Cetaroline/rifampin  Current Regimen: Tedizolid  Labs: Hepatitis B Surface Ag (no units)  Date Value  03/13/2017 Negative    CrCl: CrCl cannot be calculated (Unknown ideal weight.).  Lipids: No results found for: CHOL, TRIG, HDL, CHOLHDL, VLDL, LDLCALC  Assessment: Jason Maxwell is here today for his appt with pharmacy so we can  redo labs in him while he is on tedizolid. He is out of rifampin because he doesn't have any insurance. He may have gotten the previous supply through charity care or [facility] program. The tedizolid was held due to the neutropenia but restarted 2 days ago after the repeated labs came back better. Overall, he is doing well without much complaints. He asked of how long the treatment needs to be. We told him that it would be Dr. Zenaida Niece Maxwell's call. He still has a full bottle Tedizolid. We called [employer] today to send the refill to the clinic. It should be here by next week. He will come back and see me next month to pick up more supply and get labs. Dr.Van Maxwell doesn't have anything available until July so schedule him then.   As far as his rifampin, we contacted Jason Maxwell at [facility] and he will give the patients 2 months supply to him. He doesn't have any transportation so I'll take it to him.    Recommendations:  Continue Tedizolid 1 PO qday Start rifampin  PO BID CBC with diff today F/u with pharmacy next month for meds and labs F/u with Dr. Daiva Maxwell in July  Jason Maxwell, PharmD, BCPS, AAHIVP, CPP  Clinical Infectious Disease Pharmacist [facility] 06/12/2017, 2:23 PM

## 2017-06-12 NOTE — Therapy (Signed)
[facility] [address] [place], [place], [address] Phone: [phone]   Fax:  [phone]  Occupational Therapy Treatment  Patient Details  Name: Jason Maxwell MRN: [id number] Date of Birth: 08/04/1985 Referring Provider: Dr Caryn Bee P.Haddix   Encounter Date: 06/12/2017  OT End of Session - 06/12/17 1510    Visit Number  3    Number of Visits  8    Date for OT Re-Evaluation  07/24/17    Authorization Type  Med Pay Assurance    OT Start Time  0845    OT Stop Time  0930    OT Time Calculation (min)  45 min    Activity Tolerance  Patient tolerated treatment well    Behavior During Therapy  [facility] for tasks assessed/performed       Past Medical History:  Diagnosis Date  . Neutropenia (HCC) 05/29/2017    Past Surgical History:  Procedure Laterality Date  . arm surgery Left   . FRACTURE SURGERY    . HERNIA REPAIR Right    inguinal   . HUMERUS IM NAIL Left 03/12/2017   Procedure: INTRAMEDULLARY (IM) NAIL LEFT HUMERAL;  Surgeon: Roby Lofts, MD;  Location: [facility] OR;  Service: Orthopedics;  Laterality: Left;  . IM NAILING HUMERUS Left 03/12/2017  . INCISION AND DRAINAGE OF WOUND Left 03/12/2017   Procedure: IRRIGATION AND DEBRIDEMENT LEFT HUMEROUS WOUND;  Surgeon: Roby Lofts, MD;  Location: [facility] OR;  Service: Orthopedics;  Laterality: Left;    There were no vitals filed for this visit.  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    Limitations  no lifting > 10 lbs    Currently in Pain?  Yes    Pain Score  4     Pain Location  Shoulder    Pain Orientation  Left    Pain Type  Acute pain;Neuropathic pain    Pain Onset  More than a month ago    Aggravating Factors   nothing    Pain Relieving Factors  OTC meds                   OT Treatments/Exercises (OP) - 06/12/17 0001      Neurological Re-education Exercises   Other Exercises 1  Pt issued HEP for LUE - see pt instructions for details. Pt demo each as instructed.       Splinting   Splinting  No adjustments needed to resting hand splint (per family member report). Therapist provided pt/family with extra hook and loop velcro strapping for splint              OT Education - 06/12/17 0928    Education provided  Yes    Education Details  HEP     Person(s) Educated  Patient COUSIN   COUSIN   Methods  Explanation;Demonstration;Handout    Comprehension  Verbalized understanding;Returned demonstration Via cousin   Via cousin      OT Short Term Goals - 06/12/17 1510  OT SHORT TERM GOAL #1   Title  Pt will be Mod I splint wear and care L UE noc/day time splinting    Time  6    Period  Weeks    Status  Achieved      OT SHORT TERM GOAL #2   Title  Pt will be Mod I precautions related to absent sensation LUE for safety w/ daily activities.    Time  6    Period  Weeks    Status  On-going        OT Long Term Goals - 06/12/17 1511      OT LONG TERM GOAL #1   Title  Pt will be Mod I upgraded splinting use, care and precautions left UE    Time  12    Period  Weeks    Status  On-going      OT LONG TERM GOAL #2   Title  Pt will be I home program L UE     Time  12    Period  Weeks    Status  On-going            Plan - 06/12/17 1511    Clinical Impression Statement  Pt approximating goals. Pt would benefit from review of HEP    Occupational Profile and client history currently impacting functional performance   Pt/wife state injury occurred 10/09/2015 however chart review/MD note indicates that injury occurred 12/05/2016. Pt states he has not had any A/ROM since injury and is with limited P/ROM as well.    Occupational performance deficits (Please refer to evaluation for details):  ADL's;IADL's;Social Participation    Rehab Potential  Fair    Current Impairments/barriers affecting progress:  Pt reports no A/ROM or functional use L UE since 2017 (chart reports 12/05/2016) and limited P/ROM. LUE is flaccid.    OT Frequency  -- up to 8 visits over 12 weeks    OT Treatment/Interventions  Patient/family education;Splinting;Therapeutic activities;Passive range of motion;Manual Therapy    Plan  review HEP, try estim?     Consulted and Agree with Plan of Care  Patient;Family member/caregiver    Family Member Consulted  cousin       Patient will benefit from skilled therapeutic intervention in order to improve the following deficits and impairments:  Impaired flexibility, Decreased coordination, Impaired sensation, Decreased strength, Decreased range of motion, Impaired UE functional use  Visit Diagnosis: Muscle weakness (generalized)  Other lack of coordination    Problem List Patient Active Problem List   Diagnosis Date Noted  . Neutropenia (HCC) 05/29/2017  . Bacteremia due to Klebsiella pneumoniae 04/30/2017  . PICC line infection 04/29/2017  . Microcytic anemia 04/29/2017  . AKI (acute kidney injury) (HCC)   . MRSA infection   . 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 Fracture   . Humerus fracture   . 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 [place], [place], [address] Phone: [phone]   Fax:  [phone]  Name: Nikesh Teschner MRN: [id number] Date of Birth: 11-15-1985

## 2017-06-13 LAB — CBC WITH DIFFERENTIAL/PLATELET
BASOS ABS: 50 {cells}/uL (ref 0–200)
Basophils Relative: 1.1 %
EOS PCT: 4.7 %
Eosinophils Absolute: 212 cells/uL (ref 15–500)
HEMATOCRIT: 41 % (ref 38.5–50.0)
HEMOGLOBIN: 13.2 g/dL (ref 13.2–17.1)
LYMPHS ABS: 2543 {cells}/uL (ref 850–3900)
MCH: 21.4 pg — AB (ref 27.0–33.0)
MCHC: 32.2 g/dL (ref 32.0–36.0)
MCV: 66.5 fL — ABNORMAL LOW (ref 80.0–100.0)
Monocytes Relative: 11.8 %
NEUTROS ABS: 1166 {cells}/uL — AB (ref 1500–7800)
NEUTROS PCT: 25.9 %
Platelets: 276 10*3/uL (ref 140–400)
RBC: 6.17 10*6/uL — ABNORMAL HIGH (ref 4.20–5.80)
RDW: 19.1 % — ABNORMAL HIGH (ref 11.0–15.0)
Total Lymphocyte: 56.5 %
WBC mixed population: 531 cells/uL (ref 200–950)
WBC: 4.5 10*3/uL (ref 3.8–10.8)

## 2017-06-13 LAB — CBC MORPHOLOGY

## 2017-06-14 ENCOUNTER — Telehealth: Payer: Self-pay | Admitting: Pharmacist Clinician (PhC)/ Clinical Pharmacy Specialist

## 2017-06-14 NOTE — Telephone Encounter (Signed)
Dropped off the rifampin at his house. We will try to coordinate to pick up the 2nd month supply when he has the visit with pharmacy next month.

## 2017-06-17 ENCOUNTER — Ambulatory Visit: Payer: Self-pay | Admitting: Occupational Therapy

## 2017-06-17 DIAGNOSIS — M6281 Muscle weakness (generalized): Secondary | ICD-10-CM

## 2017-06-17 DIAGNOSIS — R208 Other disturbances of skin sensation: Secondary | ICD-10-CM

## 2017-06-17 DIAGNOSIS — R278 Other lack of coordination: Secondary | ICD-10-CM

## 2017-06-17 NOTE — Therapy (Signed)
[facility] [address] [place], [place], [address] Phone: [phone]   Fax:  [phone]  Occupational Therapy Treatment  Patient Details  Name: Jason Maxwell MRN: [id number] Date of Birth: Feb 01, 1986 Referring Provider: Dr Lennette Bihari P.Haddix   Encounter Date: 06/17/2017  OT End of Session - 06/17/17 1115    Visit Number  4    Number of Visits  8    Date for OT Re-Evaluation  07/24/17    Authorization Type  Med Pay Assurance    OT Start Time  1015    OT Stop Time  1100    OT Time Calculation (min)  45 min    Activity Tolerance  Patient tolerated treatment well       Past Medical History:  Diagnosis Date  . Neutropenia ([place]) 05/29/2017    Past Surgical History:  Procedure Laterality Date  . arm surgery Left   . FRACTURE SURGERY    . HERNIA REPAIR Right    inguinal   . HUMERUS IM NAIL Left 03/12/2017   Procedure: INTRAMEDULLARY (IM) NAIL LEFT HUMERAL;  Surgeon: Shona Needles, MD;  Location: [place];  Service: Orthopedics;  Laterality: Left;  . IM NAILING HUMERUS Left 03/12/2017  . INCISION AND DRAINAGE OF WOUND Left 03/12/2017   Procedure: IRRIGATION AND DEBRIDEMENT LEFT HUMEROUS WOUND;  Surgeon: Shona Needles, MD;  Location: [place];  Service: Orthopedics;  Laterality: Left;    There were no vitals filed for this visit.  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    Limitations  no lifting >  10 lbs    Currently in Pain?  No/denies not currently, but pain comes and goes in Lt arm/shoulder                   OT Treatments/Exercises (OP) - 06/17/17 0001      ADLs   ADL Comments  Discussed safety d/t lack of sensation LUE and also began discussion of potential A/E that may be helpful for bilateral tasks/ADLS      Neurological Re-education Exercises   Other Exercises 1  Reviewed HEP for LUE for neuro re-education as able. Pt required min cueing to perform correctly and for correct hand placement. But able to do I'ly after initial cueing.       Modalities   Modalities  Electrical engineer Stimulation Location  Dorsal forearm in attempts to illicit some wrist and finger extension (multiple electrode placements attempted) no contraindications - pt was asked via interpreter    Electrical Stimulation Action  unable to achieve wrist extension as desired. Pt did get trace finger extension 4th/5th digits only    Electrical Stimulation Parameters  50 pps, 250 pw, 10 sec. on/off cycle, up to 50 intensity (with some sensation although diminished) - however d/c after 5 min. secondary to no response    Electrical Stimulation Goals  Neuromuscular facilitation  OT Short Term Goals - 06/17/17 1115      OT SHORT TERM GOAL #1   Title  Pt will be Mod I splint wear and care L UE noc/day time splinting    Time  6    Period  Weeks    Status  Achieved      OT SHORT TERM GOAL #2   Title  Pt will be Mod I precautions related to absent sensation LUE for safety w/ daily activities.    Time  6    Period  Weeks    Status  Achieved        OT Long Term Goals - 06/17/17 1115      OT LONG TERM GOAL #1   Title  Pt will be Mod I upgraded splinting use, care and precautions left UE    Time  12    Period  Weeks    Status  Achieved      OT LONG TERM GOAL #2   Title  Pt will be I home program L UE     Time  12    Period   Weeks    Status  Achieved      OT LONG TERM GOAL #3   Title  Pt to verbalize understanding with A/E options to make ADLS/bilateral tasks easier    Time  12    Period  Weeks    Status  New            Plan - 06/17/17 1116    Clinical Impression Statement  Pt has met original STG's/LTG's. Therapist added new LTG today    Occupational Profile and client history currently impacting functional performance  Pt/wife state injury occurred 10/09/2015 however chart review/MD note indicates that injury occurred 12/05/2016. Pt states he has not had any A/ROM since injury and is with limited P/ROM as well.    Occupational performance deficits (Please refer to evaluation for details):  ADL's;IADL's;Social Participation    Rehab Potential  Fair    Current Impairments/barriers affecting progress:  Pt reports no A/ROM or functional use L UE since 2017 (chart reports 12/05/2016) and limited P/ROM. LUE is flaccid.    OT Frequency  -- up to 8 visits    OT Duration  12 weeks    OT Treatment/Interventions  Patient/family education;Splinting;Therapeutic activities;Passive range of motion;Manual Therapy    Plan  show options for tying shoes, rocker knife, other A/E prn    Consulted and Agree with Plan of Care  Patient;Other (Comment)    Family Member Consulted  interpreter       Patient will benefit from skilled therapeutic intervention in order to improve the following deficits and impairments:  Impaired flexibility, Decreased coordination, Impaired sensation, Decreased strength, Decreased range of motion, Impaired UE functional use  Visit Diagnosis: Muscle weakness (generalized)  Other lack of coordination  Other disturbances of skin sensation    Problem List Patient Active Problem List   Diagnosis Date Noted  . Neutropenia ([place]) 05/29/2017  . Bacteremia due to Klebsiella pneumoniae 04/30/2017  . PICC line infection 04/29/2017  . Microcytic anemia 04/29/2017  . AKI (acute kidney injury) ([place])    . MRSA infection   . Osteomyelitis of left humerus ([place]) 03/12/2017  . Fracture   . Humerus fracture   . 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

## 2017-06-19 ENCOUNTER — Encounter: Payer: Self-pay | Admitting: *Deleted

## 2017-06-19 ENCOUNTER — Ambulatory Visit: Payer: Self-pay | Admitting: Occupational Therapy

## 2017-06-19 DIAGNOSIS — R278 Other lack of coordination: Secondary | ICD-10-CM

## 2017-06-19 DIAGNOSIS — R208 Other disturbances of skin sensation: Secondary | ICD-10-CM

## 2017-06-19 DIAGNOSIS — M6281 Muscle weakness (generalized): Secondary | ICD-10-CM

## 2017-06-19 NOTE — Therapy (Signed)
[facility] [address] [place], [place], [address] Phone: [phone]   Fax:  [phone]  Occupational Therapy Treatment  Patient Details  Name: Jason Maxwell MRN: [id number] Date of Birth: February 25, 1985 Referring Provider: Dr Caryn Bee P.Haddix   Encounter Date: 06/19/2017  OT End of Session - 06/19/17 1324    Visit Number  5    Number of Visits  8    Date for OT Re-Evaluation  07/24/17    Authorization Type  Med Pay Assurance    OT Start Time  0930    OT Stop Time  1015    OT Time Calculation (min)  45 min    Activity Tolerance  Patient tolerated treatment well    Behavior During Therapy  [facility] for tasks assessed/performed       Past Medical History:  Diagnosis Date  . Neutropenia (HCC) 05/29/2017    Past Surgical History:  Procedure Laterality Date  . arm surgery Left   . FRACTURE SURGERY    . HERNIA REPAIR Right    inguinal   . HUMERUS IM NAIL Left 03/12/2017   Procedure: INTRAMEDULLARY (IM) NAIL LEFT HUMERAL;  Surgeon: Roby Lofts, MD;  Location: [facility] OR;  Service: Orthopedics;  Laterality: Left;  . IM NAILING HUMERUS Left 03/12/2017  . INCISION AND DRAINAGE OF WOUND Left 03/12/2017   Procedure: IRRIGATION AND DEBRIDEMENT LEFT HUMEROUS WOUND;  Surgeon: Roby Lofts, MD;  Location: [facility] OR;  Service: Orthopedics;  Laterality: Left;    There were no vitals filed for this visit.  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    Limitations  no lifting > 10  lbs    Currently in Pain?  Yes    Pain Score  3     Pain Location  Arm    Pain Orientation  Left    Pain Descriptors / Indicators  Sore muscle pain    Pain Type  Acute pain    Pain Onset  In the past 7 days    Pain Frequency  Intermittent    Aggravating Factors   after exercise    Pain Relieving Factors  rest                   OT Treatments/Exercises (OP) - 06/19/17 0001      ADLs   ADL Comments  Pt shown different A/E options for tying shoes including one handed techniques (elastic shoe laces, spyrolaces, lace locks, velcro converters, and shoe buttons).  Pt also shown rocker knife and practiced cutting food. Pt shown one handed cutting board. Provided handouts for all and told how/where to purchase. Pt also shown shelf liner (dycem) and uses for it to help stabalize under plate and help open jars.                OT Short Term Goals - 06/17/17 1115      OT SHORT TERM GOAL #1   Title  Pt will be Mod I splint wear and care L UE noc/day time splinting  Time  6    Period  Weeks    Status  Achieved      OT SHORT TERM GOAL #2   Title  Pt will be Mod I precautions related to absent sensation LUE for safety w/ daily activities.    Time  6    Period  Weeks    Status  Achieved        OT Long Term Goals - 06/19/17 1324      OT LONG TERM GOAL #1   Title  Pt will be Mod I upgraded splinting use, care and precautions left UE    Time  12    Period  Weeks    Status  Achieved      OT LONG TERM GOAL #2   Title  Pt will be I home program L UE     Time  12    Period  Weeks    Status  Achieved      OT LONG TERM GOAL #3   Title  Pt to verbalize understanding with A/E options to make ADLS/bilateral tasks easier    Time  12    Period  Weeks    Status  On-going            Plan - 06/19/17 1324    Clinical Impression Statement  Pt approximating remaining goal.     Occupational Profile and client history currently impacting functional performance  Pt/wife  state injury occurred 10/09/2015 however chart review/MD note indicates that injury occurred 12/05/2016. Pt states he has not had any A/ROM since injury and is with limited P/ROM as well.    Occupational performance deficits (Please refer to evaluation for details):  ADL's;IADL's;Social Participation    Rehab Potential  Fair    Current Impairments/barriers affecting progress:  Pt reports no A/ROM or functional use L UE since 2017 (chart reports 12/05/2016) and limited P/ROM. LUE is flaccid.    OT Frequency  -- up to 8 visits over    OT Duration  12 weeks    OT Treatment/Interventions  Patient/family education;Splinting;Therapeutic activities;Passive range of motion;Manual Therapy    Plan  continue A/E exploration prn, continue LUE ROM as able, d/c next week    Consulted and Agree with Plan of Care  Patient;Other (Comment)    Family Member Consulted  interpreter       Patient will benefit from skilled therapeutic intervention in order to improve the following deficits and impairments:  Impaired flexibility, Decreased coordination, Impaired sensation, Decreased strength, Decreased range of motion, Impaired UE functional use  Visit Diagnosis: Muscle weakness (generalized)  Other lack of coordination  Other disturbances of skin sensation    Problem List Patient Active Problem List   Diagnosis Date Noted  . Neutropenia (HCC) 05/29/2017  . Bacteremia due to Klebsiella pneumoniae 04/30/2017  . PICC line infection 04/29/2017  . Microcytic anemia 04/29/2017  . AKI (acute kidney injury) (HCC)   . MRSA infection   . 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 Fracture   . Humerus fracture   . 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

## 2017-06-24 ENCOUNTER — Ambulatory Visit: Payer: Self-pay | Admitting: Occupational Therapy

## 2017-06-24 DIAGNOSIS — M6281 Muscle weakness (generalized): Secondary | ICD-10-CM

## 2017-06-24 DIAGNOSIS — R208 Other disturbances of skin sensation: Secondary | ICD-10-CM

## 2017-06-24 NOTE — Therapy (Signed)
[facility] [address] [place], [place], [address] Phone: [phone]   Fax:  [phone]  Occupational Therapy Treatment  Patient Details  Name: Jason Maxwell MRN: [id number] Date of Birth: 1985/04/04 Referring Provider: Dr Lennette Bihari P.Haddix   Encounter Date: 06/24/2017  OT End of Session - 06/24/17 1339    Visit Number  6    Number of Visits  8    Date for OT Re-Evaluation  07/24/17    Authorization Type  Med Pay Assurance    OT Start Time  0935    OT Stop Time  1020    OT Time Calculation (min)  45 min    Activity Tolerance  Patient tolerated treatment well       Past Medical History:  Diagnosis Date  . Neutropenia ([place]) 05/29/2017    Past Surgical History:  Procedure Laterality Date  . arm surgery Left   . FRACTURE SURGERY    . HERNIA REPAIR Right    inguinal   . HUMERUS IM NAIL Left 03/12/2017   Procedure: INTRAMEDULLARY (IM) NAIL LEFT HUMERAL;  Surgeon: Shona Needles, MD;  Location: [place];  Service: Orthopedics;  Laterality: Left;  . IM NAILING HUMERUS Left 03/12/2017  . INCISION AND DRAINAGE OF WOUND Left 03/12/2017   Procedure: IRRIGATION AND DEBRIDEMENT LEFT HUMEROUS WOUND;  Surgeon: Shona Needles, MD;  Location: [place];  Service: Orthopedics;  Laterality: Left;    There were no vitals filed for this visit.  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    Limitations  no lifting > 10 lbs    Currently in Pain?  Yes    Pain Score  4  however no  pain currently    Pain Location  Shoulder    Pain Orientation  Left    Pain Type  Chronic pain    Pain Onset  More than a month ago    Pain Frequency  Intermittent    Aggravating Factors   more movement    Pain Relieving Factors  rest                   OT Treatments/Exercises (OP) - 06/24/17 0001      ADLs   ADL Comments  Discussion with patient (via interpreter) re: possible d/c this week, however decided to cancel remaining appointment this week and reduce frequency back to 1x/wk over 2 weeks (thus only adding 1 more visit). Therapist explained however, that most likely we will probably not go beyond that unless more movement develops in the LUE (pt very limited elbow distally d/t nerve damage from infection, and has not had movement in the forearm, wrist or hand LUE since 2017). Pt did report he felt his arm was "waking up" somewhat and tingling more now      Neurological Re-education Exercises   Other Exercises 1  Worked extensively on AA/ROM for shoulder flex, sh. horizontal abd/add,  elbow flex/ext, and forearm supination in gravity eliminated planes with min to mod facilitation to prevent forearm pronation and  wrist flexion. Pt was able to perform with only min assist at shoulder and with mod to max assist at elbow, max assist for forearm               OT Short Term Goals - 06/17/17 1115      OT SHORT TERM GOAL #1   Title  Pt will be Mod I splint wear and care L UE noc/day time splinting    Time  6    Period  Weeks    Status  Achieved      OT SHORT TERM GOAL #2   Title  Pt will be Mod I precautions related to absent sensation LUE for safety w/ daily activities.    Time  6    Period  Weeks    Status  Achieved        OT Long Term Goals - 06/24/17 1339      OT LONG TERM GOAL #1   Title  Pt will be Mod I upgraded splinting use, care and precautions left UE    Time  12    Period  Weeks    Status  Achieved      OT LONG TERM GOAL #2   Title  Pt will  be I home program L UE     Time  12    Period  Weeks    Status  Achieved      OT LONG TERM GOAL #3   Title  Pt to verbalize understanding with A/E options to make ADLS/bilateral tasks easier    Time  12    Period  Weeks    Status  Achieved      OT LONG TERM GOAL #4   Title  Independent with updated HEP for LUE     Time  12    Period  Weeks    Status  New            Plan - 06/24/17 1340    Clinical Impression Statement  Pt met remaining LTG. Therapist added new goal (LTG #4)     Occupational Profile and client history currently impacting functional performance  Pt/wife state injury occurred 10/09/2015 however chart review/MD note indicates that injury occurred 12/05/2016. Pt states he has not had any A/ROM since injury and is with limited P/ROM as well.    Occupational performance deficits (Please refer to evaluation for details):  ADL's;IADL's;Social Participation    Rehab Potential  Fair    Current Impairments/barriers affecting progress:  Pt reports no A/ROM or functional use L UE since 2017 (chart reports 12/05/2016) and limited P/ROM. LUE is flaccid.    OT Frequency  -- up to 8 visits over    OT Duration  12 weeks    OT Treatment/Interventions  Patient/family education;Splinting;Therapeutic activities;Passive range of motion;Manual Therapy    Plan  reduce to 1x/wk for 2 more weeks (see note) - continue LUE ROM as able working in gravity elim planes    Consulted and Agree with Plan of Care  Patient;Other (Comment)    Family Member Consulted  interpreter       Patient will benefit from skilled therapeutic intervention in order to improve the following deficits and impairments:  Impaired flexibility, Decreased coordination, Impaired sensation, Decreased strength, Decreased range of motion, Impaired UE functional use  Visit Diagnosis: Muscle weakness (generalized)  Other disturbances of skin sensation    Problem List Patient Active Problem List   Diagnosis Date Noted  .  Neutropenia ([place])  05/29/2017  . Bacteremia due to Klebsiella pneumoniae 04/30/2017  . PICC line infection 04/29/2017  . Microcytic anemia 04/29/2017  . AKI (acute kidney injury) ([place])   . MRSA infection   . Osteomyelitis of left humerus ([place]) 03/12/2017  . Fracture   . Humerus fracture   . 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 [place], [place], [address] Phone: [phone]   Fax:  [phone]  Name: Jessy Cybulski MRN: [id number] Date of Birth: May 11, 1985

## 2017-06-25 ENCOUNTER — Ambulatory Visit: Payer: Self-pay | Admitting: Occupational Therapy

## 2017-07-02 ENCOUNTER — Ambulatory Visit: Payer: Self-pay | Admitting: *Deleted

## 2017-07-02 ENCOUNTER — Encounter: Payer: Self-pay | Admitting: *Deleted

## 2017-07-02 DIAGNOSIS — R278 Other lack of coordination: Secondary | ICD-10-CM

## 2017-07-02 DIAGNOSIS — M6281 Muscle weakness (generalized): Secondary | ICD-10-CM

## 2017-07-02 DIAGNOSIS — R208 Other disturbances of skin sensation: Secondary | ICD-10-CM

## 2017-07-02 NOTE — Therapy (Signed)
[facility] [address] [place], [place], [address] Phone: [phone]   Fax:  [phone]  Occupational Therapy Treatment  Patient Details  Name: Jason Maxwell MRN: [id number] Date of Birth: [birth year] Referring Provider: Dr Caryn Bee P.Haddix   Encounter Date: 07/02/2017  OT End of Session - 07/02/17 1155    Visit Number  7    Number of Visits  8    Date for OT Re-Evaluation  07/24/17    Authorization Type  Med Pay Assurance    OT Start Time  1108    OT Stop Time  1147    OT Time Calculation (min)  39 min    Activity Tolerance  Patient tolerated treatment well    Behavior During Therapy  [facility] for tasks assessed/performed       Past Medical History:  Diagnosis Date  . Neutropenia (HCC) 05/29/2017    Past Surgical History:  Procedure Laterality Date  . arm surgery Left   . FRACTURE SURGERY    . HERNIA REPAIR Right    inguinal   . HUMERUS IM NAIL Left 03/12/2017   Procedure: INTRAMEDULLARY (IM) NAIL LEFT HUMERAL;  Surgeon: Roby Lofts, MD;  Location: [facility];  Service: Orthopedics;  Laterality: Left;  . IM NAILING HUMERUS Left 03/12/2017  . INCISION AND DRAINAGE OF WOUND Left 03/12/2017   Procedure: IRRIGATION AND DEBRIDEMENT LEFT HUMEROUS WOUND;  Surgeon: Roby Lofts, MD;  Location: [facility];  Service: Orthopedics;  Laterality: Left;    There were no vitals filed for this visit.  Subjective Assessment - 07/02/17 1111    Subjective   Pt reports that he is feeling better with some shoulder movement but denies any active elbow or wrist ROM at this time LUE. Denoes pain.    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    Limitations  no lifting > 10 lbs    Currently in Pain?  No/denies    Pain Score  0-No pain    Multiple Pain Sites  No                   OT Treatments/Exercises (OP) - 07/02/17 0001      Neurological Re-education Exercises   Other Exercises 1  Cont to focus on AA/ROM for shoulder flex, sh. horizontal abd/add,  elbow flex/ext, and forearm supination in gravity eliminated planes seated at table using towel and ball. Pt was min to mod facilitation to prevent forearm pronation and wrist flexion. Pt was able to perform with only min assist at shoulder and with mod-max assist at elbow, mod-max assist for forearm. Pt reports that he is currently performing ex's 3x/day at home x10 reps each.              OT Education - 07/02/17 1153    Education provided  Yes    Education Details  HEP added grav eliminated forearm and shoulder ex's on ball/seated at [employer]) Educated  Patient;Other (comment) Family and interpreter   Family and interpreter   Methods  Explanation;Demonstration;Verbal cues    Comprehension  Verbalized understanding;Returned demonstration Via Interpreter   Via Interpreter  OT Short Term Goals - 06/17/17 1115      OT SHORT TERM GOAL #1   Title  Pt will be Mod I splint wear and care L UE noc/day time splinting    Time  6    Period  Weeks    Status  Achieved      OT SHORT TERM GOAL #2   Title  Pt will be Mod I precautions related to absent sensation LUE for safety w/ daily activities.    Time  6    Period  Weeks    Status  Achieved        OT Long Term Goals - 06/24/17 1339      OT LONG TERM GOAL #1   Title  Pt will be Mod I upgraded splinting use, care and precautions left UE    Time  12    Period  Weeks    Status  Achieved      OT LONG TERM GOAL #2   Title  Pt will be I home program L UE     Time  12    Period  Weeks    Status  Achieved      OT LONG TERM  GOAL #3   Title  Pt to verbalize understanding with A/E options to make ADLS/bilateral tasks easier    Time  12    Period  Weeks    Status  Achieved      OT LONG TERM GOAL #4   Title  Independent with updated HEP for LUE     Time  12    Period  Weeks    Status  New            Plan - 07/02/17 1155    Clinical Impression Statement  Cont with focus on remaining LTG and anticipate d/c to independent home program next visit.    Occupational Profile and client history currently impacting functional performance  Pt/wife state injury occurred 10/09/2015 however chart review/MD note indicates that injury occurred 12/05/2016. Pt states he has not had any A/ROM since injury and is with limited P/ROM as well.    Occupational performance deficits (Please refer to evaluation for details):  ADL's;IADL's;Social Participation    Rehab Potential  Fair    Current Impairments/barriers affecting progress:  Pt reports no A/ROM or functional use L UE since 2017 (chart reports 12/05/2016) and limited P/ROM. LUE is flaccid.    OT Frequency  Other (comment) Up to 8 visits over 12 weeks    OT Duration  12 weeks    OT Treatment/Interventions  Patient/family education;Splinting;Therapeutic activities;Passive range of motion;Manual Therapy;Self-care/ADL training    Plan  Check remaining LTG w/ focus on LUE ROM as able in gravity eliminated planes, anticipate d/c.    Clinical Decision Making  Limited treatment options, no task modification necessary    Consulted and Agree with Plan of Care  Patient;Other (Comment)    Family Member Consulted  interpreter       Patient will benefit from skilled therapeutic intervention in order to improve the following deficits and impairments:  Impaired flexibility, Decreased coordination, Impaired sensation, Decreased strength, Decreased range of motion, Impaired UE functional use  Visit Diagnosis: Muscle weakness (generalized)  Other disturbances of skin sensation  Other  lack of coordination    Problem List Patient Active Problem List   Diagnosis Date Noted  . Neutropenia (HCC) 05/29/2017  . Bacteremia due to Klebsiella pneumoniae 04/30/2017  . PICC line infection 04/29/2017  .  Microcytic anemia 04/29/2017  . AKI (acute kidney injury) (HCC)   . MRSA infection   . 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 Fracture   . Humerus fracture   . 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 [place], [place], [address] Phone: [phone]   Fax:  [phone]  Name: Jason Maxwell MRN: [id number] Date of Birth: 16-Dec-1985

## 2017-07-07 ENCOUNTER — Ambulatory Visit: Payer: Self-pay | Attending: Student | Admitting: Occupational Therapy

## 2017-07-07 DIAGNOSIS — M6281 Muscle weakness (generalized): Secondary | ICD-10-CM | POA: Insufficient documentation

## 2017-07-07 DIAGNOSIS — R208 Other disturbances of skin sensation: Secondary | ICD-10-CM | POA: Insufficient documentation

## 2017-07-07 NOTE — Patient Instructions (Signed)
OTHER: Prone on Elbows    Place elbows directly under shoulders. Shift weight from side to side. Hold _3__ seconds. _10__ reps per set, _2__ sets per day  On Elbows (Prone)    Rise up on elbows as high as possible, keeping hips on floor. Hold __5__ seconds. Repeat _5___ times per set. Do __1__ sets per session. Do _2___ sessions per day.  SELF ASSISTED WITH OBJECT: Shoulder Horizontal Abduction (Skate)    Place arm on table. Move arm away from body, straighten elbow, open hand. _10__ reps per set, _2__ sets per day  ELBOW: Flexion - Sitting (Table Top)    Rest arm on table, elbow straight. Bend elbow. _10__ reps per set, _2__ sets per day

## 2017-07-07 NOTE — Therapy (Signed)
[facility] [address] [place], [place], [address] Phone: [phone]   Fax:  [phone]  Occupational Therapy Treatment  Patient Details  Name: Jason Maxwell MRN: [id number] Date of Birth: Jul 11, 1985 Referring Provider: Dr Lennette Bihari P.Haddix   Encounter Date: 07/07/2017  OT End of Session - 07/07/17 0943    Visit Number  8    Number of Visits  8    Date for OT Re-Evaluation  07/24/17    Authorization Type  Med Pay Assurance    OT Start Time  [phone]    OT Stop Time  0930    OT Time Calculation (min)  35 min    Activity Tolerance  Patient tolerated treatment well    Behavior During Therapy  [facility] for tasks assessed/performed       Past Medical History:  Diagnosis Date  . Neutropenia ([place]) 05/29/2017    Past Surgical History:  Procedure Laterality Date  . arm surgery Left   . FRACTURE SURGERY    . HERNIA REPAIR Right    inguinal   . HUMERUS IM NAIL Left 03/12/2017   Procedure: INTRAMEDULLARY (IM) NAIL LEFT HUMERAL;  Surgeon: Shona Needles, MD;  Location: [facility];  Service: Orthopedics;  Laterality: Left;  . IM NAILING HUMERUS Left 03/12/2017  . INCISION AND DRAINAGE OF WOUND Left 03/12/2017   Procedure: IRRIGATION AND DEBRIDEMENT LEFT HUMEROUS WOUND;  Surgeon: Shona Needles, MD;  Location: [place] [facility];  Service: Orthopedics;  Laterality: Left;    There were no vitals filed for this visit.  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    Limitations  no lifting > 10 lbs    Currently in Pain?   No/denies                   OT Treatments/Exercises (OP) - 07/07/17 0001      Neurological Re-education Exercises   Other Exercises 1  Pt issued updated HEP for LUE neuro re-education - see pt instructions for details. Pt demo each as instructed. Pt also given place and hold ex's for forearm supination             OT Education - 07/07/17 0925    Education provided  Yes    Education Details  Updated HEP    Person(s) Educated  Patient    Methods  Explanation;Demonstration;Handout VIA INTERPRETER   VIA INTERPRETER   Comprehension  Verbalized understanding;Returned demonstration       OT Short Term Goals - 06/17/17 1115      OT SHORT TERM GOAL #1   Title  Pt will be Mod I splint wear and care L UE noc/day time splinting    Time  6    Period  Weeks    Status  Achieved      OT SHORT TERM GOAL #2   Title  Pt will be Mod I precautions related to absent sensation LUE for safety w/ daily activities.    Time  6    Period  Weeks    Status  Achieved  OT Long Term Goals - 07/07/17 0944      OT LONG TERM GOAL #1   Title  Pt will be Mod I upgraded splinting use, care and precautions left UE    Time  12    Period  Weeks    Status  Achieved      OT LONG TERM GOAL #2   Title  Pt will be I home program L UE     Time  12    Period  Weeks    Status  Achieved      OT LONG TERM GOAL #3   Title  Pt to verbalize understanding with A/E options to make ADLS/bilateral tasks easier    Time  12    Period  Weeks    Status  Achieved      OT LONG TERM GOAL #4   Title  Independent with updated HEP for LUE     Time  12    Period  Weeks    Status  Achieved            Plan - 07/07/17 0944    Clinical Impression Statement  Pt has met all goals and has reached maximal rehab potential at this time.     Occupational Profile and client history currently impacting functional performance  Pt/wife state injury occurred 10/09/2015 however chart review/MD note indicates  that injury occurred 12/05/2016. Pt states he has not had any A/ROM since injury and is with limited P/ROM as well.    Rehab Potential  Fair    Current Impairments/barriers affecting progress:  Pt reports no A/ROM or functional use L UE since 2017 (chart reports 12/05/2016) and limited P/ROM. LUE is flaccid.    OT Treatment/Interventions  Patient/family education;Splinting;Therapeutic activities;Passive range of motion;Manual Therapy;Self-care/ADL training    Plan  D/C O.Jason Maxwell and Agree with Plan of Care  Patient;Other (Comment)    Family Member Consulted  interpreter       Patient will benefit from skilled therapeutic intervention in order to improve the following deficits and impairments:  Impaired flexibility, Decreased coordination, Impaired sensation, Decreased strength, Decreased range of motion, Impaired UE functional use  Visit Diagnosis: Muscle weakness (generalized)  Other disturbances of skin sensation    Problem List Patient Active Problem List   Diagnosis Date Noted  . Neutropenia ([place]) 05/29/2017  . Bacteremia due to Klebsiella pneumoniae 04/30/2017  . PICC line infection 04/29/2017  . Microcytic anemia 04/29/2017  . AKI (acute kidney injury) ([place])   . MRSA infection   . Osteomyelitis of left humerus ([place]) 03/12/2017  . Fracture   . Humerus fracture   . 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  Patient goals were met. Patient is being discharged due to meeting the stated rehab goals.  And reaching maximal rehab potential at this time. ?????        Carey Bullocks, OTR/L 07/07/2017, 9:45 AM  [facility] [address] [place], [place], [address] Phone: [phone]   Fax:  [phone]  Name: Jason Maxwell MRN: [id number] Date of Birth: 02-16-1985

## 2017-07-09 ENCOUNTER — Ambulatory Visit (INDEPENDENT_AMBULATORY_CARE_PROVIDER_SITE_OTHER): Payer: Self-pay | Admitting: Pharmacist Clinician (PhC)/ Clinical Pharmacy Specialist

## 2017-07-09 DIAGNOSIS — M86322 Chronic multifocal osteomyelitis, left humerus: Secondary | ICD-10-CM

## 2017-07-09 MED FILL — rifAMPin 300 MG CAPS: 300 | 30 days supply | Qty: 60 | Fill #1

## 2017-07-09 NOTE — Progress Notes (Signed)
HPI: Jason Maxwell is a 32 y.o. male who is here for his visit with pharmacy to pick up his meds..  Allergies: Allergies  Allergen Reactions  . Pork-Derived Products     Religious reasons     Vitals:    Past Medical History: Past Medical History:  Diagnosis Date  . Neutropenia ([facility]) 05/29/2017    Social History: Social History   Socioeconomic History  . Marital status: Married    Spouse name: Not on file  . Number of children: Not on file  . Years of education: Not on file  . Highest education level: Not on file  Occupational History  . Not on file  Social Needs  . Financial resource strain: Not on file  . Food insecurity:    Worry: Not on file    Inability: Not on file  . Transportation needs:    Medical: Not on file    Non-medical: Not on file  Tobacco Use  . Smoking status: Former Smoker    Last attempt to quit: 03/12/2015    Years since quitting: 2.3  . Smokeless tobacco: Never Used  Substance and Sexual Activity  . Alcohol use: No  . Drug use: No  . Sexual activity: Not on file  Lifestyle  . Physical activity:    Days per week: Not on file    Minutes per session: Not on file  . Stress: Not on file  Relationships  . Social connections:    Talks on phone: Not on file    Gets together: Not on file    Attends religious service: Not on file    Active member of club or organization: Not on file    Attends meetings of clubs or organizations: Not on file    Relationship status: Not on file  Other Topics Concern  . Not on file  Social History Narrative  . Not on file    Previous Regimen: Vanc/ceftriaxone/rifampin>>Cetaroline/rifampin  Current Regimen: Tedizolid/rifampin  Labs: Hepatitis B Surface Ag (no units)  Date Value  03/13/2017 Negative    CrCl: CrCl cannot be calculated (Patient's most recent lab result is older than the maximum 21 days allowed.).  Lipids: No results found for: CHOL, TRIG, HDL, CHOLHDL, VLDL,  LDLCALC  Assessment: Ocean is here for his visit with pharmacy for his abx and labs. He has been on the combination for the osteomyelitis of his arm. He has no complaints about any symptoms at the visit. He was very grateful for the care that he gets here. We handed him another month of tedizolid from [facility] today. Obviously, he [facility] but due to his transportation issue and language barrier, we walked over to outpatient pharmacy to pick up the med and gave it to him while he is here. We will get labs today especially CBC due to his neutropenia issue.   He has an appt with Dr Daiva EvesVan Dam next month to determine if his therapy is still needed at that point. We will get [employer] to ship the next month supply so we have on hand. [facility] pharmacy has been generous enough to give him the rifampin so far.   Recommendations:  CBC w/diff Continue Tedizolid/rifampin until appt with Dr. Daiva EvesVan Dam Call [employer] to send next refill  Ulyses SouthwardMinh Macaela Presas, PharmD, BCPS, AAHIVP, CPP Clinical Infectious Disease Pharmacist [facility] 07/09/2017, 1:38 PM

## 2017-07-10 LAB — CBC WITH DIFFERENTIAL/PLATELET
BASOS PCT: 1.4 %
Basophils Absolute: 60 cells/uL (ref 0–200)
Eosinophils Absolute: 198 cells/uL (ref 15–500)
Eosinophils Relative: 4.6 %
HCT: 41.5 % (ref 38.5–50.0)
Hemoglobin: 13.4 g/dL (ref 13.2–17.1)
Lymphs Abs: 2012 cells/uL (ref 850–3900)
MCH: 20.7 pg — ABNORMAL LOW (ref 27.0–33.0)
MCHC: 32.3 g/dL (ref 32.0–36.0)
MCV: 64.2 fL — ABNORMAL LOW (ref 80.0–100.0)
MONOS PCT: 14.7 %
NEUTROS PCT: 32.5 %
Neutro Abs: 1398 cells/uL — ABNORMAL LOW (ref 1500–7800)
Platelets: 228 10*3/uL (ref 140–400)
RBC: 6.46 10*6/uL — AB (ref 4.20–5.80)
RDW: 20.9 % — AB (ref 11.0–15.0)
TOTAL LYMPHOCYTE: 46.8 %
WBC mixed population: 632 cells/uL (ref 200–950)
WBC: 4.3 10*3/uL (ref 3.8–10.8)

## 2017-07-10 LAB — CBC MORPHOLOGY

## 2017-07-14 ENCOUNTER — Telehealth: Payer: Self-pay | Admitting: Pharmacist Clinician (PhC)/ Clinical Pharmacy Specialist

## 2017-07-14 NOTE — Telephone Encounter (Signed)
[facility] will ship his tedizolid to the clinic next week. Just in case he will need it at the next appt with Dr. Daiva EvesVan Dam

## 2017-07-22 ENCOUNTER — Encounter: Payer: Self-pay | Admitting: Occupational Therapy

## 2017-08-08 ENCOUNTER — Telehealth: Payer: Self-pay | Admitting: Pharmacist Clinician (PhC)/ Clinical Pharmacy Specialist

## 2017-08-08 ENCOUNTER — Other Ambulatory Visit: Payer: Self-pay | Admitting: Pharmacist Clinician (PhC)/ Clinical Pharmacy Specialist

## 2017-08-08 ENCOUNTER — Ambulatory Visit (INDEPENDENT_AMBULATORY_CARE_PROVIDER_SITE_OTHER): Payer: Self-pay | Admitting: Infectious Disease

## 2017-08-08 ENCOUNTER — Encounter: Payer: Self-pay | Admitting: Infectious Disease

## 2017-08-08 VITALS — BP 139/90 | HR 83 | Temp 98.3°F | Ht 71.0 in | Wt 190.0 lb

## 2017-08-08 DIAGNOSIS — D702 Other drug-induced agranulocytosis: Secondary | ICD-10-CM

## 2017-08-08 DIAGNOSIS — Z598 Other problems related to housing and economic circumstances: Secondary | ICD-10-CM

## 2017-08-08 DIAGNOSIS — M86422 Chronic osteomyelitis with draining sinus, left humerus: Secondary | ICD-10-CM

## 2017-08-08 DIAGNOSIS — A4902 Methicillin resistant Staphylococcus aureus infection, unspecified site: Secondary | ICD-10-CM

## 2017-08-08 DIAGNOSIS — Z599 Problem related to housing and economic circumstances, unspecified: Secondary | ICD-10-CM

## 2017-08-08 MED ORDER — RIFAMPIN 300 MG PO CAPS: 300.0000 mg | ORAL_CAPSULE | Freq: Two times a day (BID) | ORAL | 3 refills | Status: DC

## 2017-08-08 MED FILL — rifAMPin 300 MG CAPS: 300 | 30 days supply | Qty: 60 | Fill #0

## 2017-08-08 NOTE — Patient Instructions (Signed)
Please schedule appt for Johsua to see pharmacy in one month and pick up his antibiotics  Please schedule with me in 2 months

## 2017-08-08 NOTE — Telephone Encounter (Signed)
Dr. Daiva EvesVan Dam would like to continue him on Tedizolid and rifampin for his arm osteo. [facility] pharmacy has been mailing his Tedizolid here and we have been asking [facility] to get him the rifampin for cheap. Gave him the Tedizolid here today. Will deliver the rifampin to his house afterwards today. Since [facility] pharmacy is out of refill for Tedizolid. Left a detail message for the next Rx with 3 refills. Asked them to continue to mail to clinic. [employer] approved the [employer] Inced[employer]olid for a year.

## 2017-08-08 NOTE — Progress Notes (Signed)
Subjective:   Chief complaint: arm pain   Patient ID: Jason Maxwell, male    DOB: 04/13/1985, 32 y.o.   MRN: [id number]  HPI  31 y.o. male with with chronic osteomyelitis associated with hardware with draining sinuses x 6 months while in [place], moved to [place] ultimately has had Disphysectomy of humerus for osteomyelitis , Intramedullary nailing of left humerus, Insertion of antibiotic cement spacer.   Unfortunately all of his infected bone and tissue that was meticulously removed was sent to pathology ALONE and placed in formalin. Therefore we could never get a culture to guide therapy.  We do have a clue to a potential organism based on a MDR MRSA that grew from culture from sinus in October, 2018.  We covered him with IV vancomcyin, Ceftriaxone and oral rifampin in the interim. We ran into issues with vancomycin and had to change him to Teflaro 600mg  IV q 12 hours and added in levaquin orally with continued oral rifampin.  He has had very nice improvement in his arm which he is able to swing around now. He said he can even participate in some sports which he could not previously do due to degree of pain in his arm. He has seen Dr. Jena Gauss in followup who has liberated him from his sling.  In the interim he had Klebsiella GNR bacteremia associated with PICC which was removed and treated. He has   changed over to tedizolied but rifampin dropped off after not having been refilled. He had leukopenia and Neutropenia at last blood draw but we were not able to check blood work or get in touch with him until he came to appt at last visit.  Fortunately his neutropenia resolved and does not appear to have been related to his tedezolid.  HE has continued on TZD and RIF since then and his arm continues to feel better and better.  He does still at times have a sensation of some pain underneath the skin that is kind of a pinching sensation he describes.  He is still unable to use his hand but he is  able to run which is 1 of his main pleasures.  He has several other concerns today and asks if there is any type of foundation for people who have injured their arms that can help them with obtaining housing employment and such things.  He himself is very lonely now living here in the [place] without any of his family members who have moved back to [place].  He also has trouble finding employment and his housing situation is not the most stable one either.  I have talked with our nursing staff and they have talked with Ladona Ridgel from [facility] and will try to connect him to some clinics in the area that might potentially have resources to help him.  [facility].       Past Medical History:  Diagnosis Date  . Neutropenia ([facility]) 05/29/2017    Past Surgical History:  Procedure Laterality Date  . arm surgery Left   . FRACTURE SURGERY    . HERNIA REPAIR Right    inguinal   . HUMERUS IM NAIL Left 03/12/2017   Procedure: INTRAMEDULLARY (IM) NAIL LEFT HUMERAL;  Surgeon: Roby Lofts, MD;  Location: [facility];  Service: Orthopedics;  Laterality: Left;  . IM NAILING HUMERUS Left 03/12/2017  . INCISION AND DRAINAGE OF WOUND Left 03/12/2017   Procedure: IRRIGATION AND DEBRIDEMENT LEFT HUMEROUS WOUND;  Surgeon: Roby LoftsHaddix, Kevin P, MD;  Location: [facility] OR;  Service: Orthopedics;  Laterality: Left;    No family history on file.    Social History   Socioeconomic History  . Marital status: Married    Spouse name: Not on file  . Number of children: Not on file  . Years of education: Not on file  . Highest education level: Not on file  Occupational History  . Not on file  Social Needs  . Financial resource strain: Not on file  . Food insecurity:    Worry: Not on file    Inability: Not on file  . Transportation needs:    Medical: Not on file    Non-medical: Not on file  Tobacco Use  . Smoking status: Former Smoker    Last attempt to quit: 03/12/2015      Years since quitting: 2.4  . Smokeless tobacco: Never Used  Substance and Sexual Activity  . Alcohol use: No  . Drug use: No  . Sexual activity: Not on file  Lifestyle  . Physical activity:    Days per week: Not on file    Minutes per session: Not on file  . Stress: Not on file  Relationships  . Social connections:    Talks on phone: Not on file    Gets together: Not on file    Attends religious service: Not on file    Active member of club or organization: Not on file    Attends meetings of clubs or organizations: Not on file    Relationship status: Not on file  Other Topics Concern  . Not on file  Social History Narrative  . Not on file    Allergies  Allergen Reactions  . Pork-Derived Products     Religious reasons      Current Outpatient Medications:  .  rifampin (RIFADIN) 300 MG capsule, Take 1 capsule (300 mg total) by mouth 2 (two) times daily., Disp: 60 capsule, Rfl: 1 .  Tedizolid Phosphate 200 MG TABS, Take 200 mg by mouth daily., Disp: 30 tablet, Rfl: 0 .  acetaminophen (TYLENOL) 500 MG tablet, Take 1 tablet (500 mg total) by mouth every 6 (six) hours as needed. (Patient not taking: Reported on 05/29/2017), Disp: 30 tablet, Rfl: 0 .  ondansetron (ZOFRAN) 4 MG tablet, Take 1 tablet (4 mg total) by mouth every 8 (eight) hours as needed for nausea or vomiting. (Patient not taking: Reported on 06/12/2017), Disp: 10 tablet, Rfl: 0 .  polyethylene glycol (MIRALAX / GLYCOLAX) packet, Take 17 g by mouth daily as needed for mild constipation., Disp: 14 each, Rfl: 0     Review of Systems  Constitutional: Negative for chills and fever.  HENT: Negative for congestion and sore throat.   Eyes: Negative for photophobia.  Respiratory: Negative for cough, shortness of breath and wheezing.   Cardiovascular: Negative for chest pain, palpitations and leg swelling.  Gastrointestinal: Negative for abdominal pain, blood in stool, constipation, diarrhea, nausea and vomiting.   Genitourinary: Negative for dysuria, flank pain and hematuria.  Musculoskeletal: Positive for myalgias. Negative for back pain.  Skin: Positive for wound. Negative for rash.  Neurological: Positive for weakness. Negative for dizziness and headaches.  Hematological: Does not bruise/bleed easily.  Psychiatric/Behavioral: Negative for suicidal ideas.       Objective:   Physical Exam  Constitutional: He is oriented to person, place, and time. He appears well-developed and well-nourished. No distress.  HENT:  Head: Normocephalic and atraumatic.  Mouth/Throat: No  oropharyngeal exudate.  Eyes: Conjunctivae and EOM are normal. No scleral icterus.  Neck: Normal range of motion. Neck supple.  Cardiovascular: Normal rate and regular rhythm.  Pulmonary/Chest: Effort normal. No respiratory distress. He has no wheezes.  Abdominal: He exhibits no distension.  Musculoskeletal: He exhibits no edema or tenderness.  Neurological: He is alert and oriented to person, place, and time. He exhibits normal muscle tone. Coordination normal.  Skin: Skin is warm and dry. He is not diaphoretic.  Psychiatric: He has a normal mood and affect. His behavior is normal. Judgment and thought content normal.   He can swing his left arm but cannot use fingers in hand to grip anything  He has chronic skin changes related to his infection.  05/29/17:     August 08, 2017:           Assessment & Plan:   Chronic osteomyelitis with draining sinuses and hardware sp removal of much of hardware, debridement IM nailing of humerus  Concern is for MDR MRSA as culprit organism  H  We will recheck a CBC with differential and a conference of metabolic panel today if they are normal we can continue to repeat proceed with rifampin and also with Tedezolid  I would want to get minimum of 6 months of therapy but perhaps a year though I am doubtful he will be able to be on the TZD for that long we may indeed have to change  him to clindamycin for that part of his regimen.  Financial and other stressors: We will try to get him connected to an internal medicine clinic in the city that has abilities to specifically help someone like himself who is a [place] speaking immigrant with limited resources.  I spent greater than 25 minutes with the patient including greater than 50% of time in face to face counsel of the patient using remote iPad telephonic translation system to review his course and the reasons for his specific antibiotics and the risks and benefits to them our plan for treatment and also with regards to his various stressors that he is in need of having help with including his financial and emotional stresses and need for employment and in coordination of their care.

## 2017-08-08 NOTE — Telephone Encounter (Signed)
Thanks Rogelia BogaMinh you are the man!

## 2017-08-08 NOTE — Progress Notes (Signed)
Sending rifampin to [facility] for $20/mon.

## 2017-08-09 LAB — CBC WITH DIFFERENTIAL/PLATELET
BASOS ABS: 50 {cells}/uL (ref 0–200)
Basophils Relative: 1.6 %
EOS PCT: 5.5 %
Eosinophils Absolute: 171 cells/uL (ref 15–500)
HEMATOCRIT: 40.7 % (ref 38.5–50.0)
HEMOGLOBIN: 13.2 g/dL (ref 13.2–17.1)
LYMPHS ABS: 1640 {cells}/uL (ref 850–3900)
MCH: 20.7 pg — AB (ref 27.0–33.0)
MCHC: 32.4 g/dL (ref 32.0–36.0)
MCV: 63.9 fL — ABNORMAL LOW (ref 80.0–100.0)
Monocytes Relative: 16.9 %
NEUTROS ABS: 716 {cells}/uL — AB (ref 1500–7800)
Neutrophils Relative %: 23.1 %
Platelets: 217 10*3/uL (ref 140–400)
RBC: 6.37 10*6/uL — ABNORMAL HIGH (ref 4.20–5.80)
RDW: 22.3 % — ABNORMAL HIGH (ref 11.0–15.0)
Total Lymphocyte: 52.9 %
WBC: 3.1 10*3/uL — ABNORMAL LOW (ref 3.8–10.8)
WBCMIX: 524 {cells}/uL (ref 200–950)

## 2017-08-09 LAB — COMPLETE METABOLIC PANEL WITH GFR
AG RATIO: 2 (calc) (ref 1.0–2.5)
ALT: 13 U/L (ref 9–46)
AST: 17 U/L (ref 10–40)
Albumin: 4.6 g/dL (ref 3.6–5.1)
Alkaline phosphatase (APISO): 81 U/L (ref 40–115)
BILIRUBIN TOTAL: 0.5 mg/dL (ref 0.2–1.2)
BUN: 11 mg/dL (ref 7–25)
CALCIUM: 9.2 mg/dL (ref 8.6–10.3)
CHLORIDE: 104 mmol/L (ref 98–110)
CO2: 23 mmol/L (ref 20–32)
Creat: 1.21 mg/dL (ref 0.60–1.35)
GFR, EST AFRICAN AMERICAN: 92 mL/min/{1.73_m2} (ref >=60)
GFR, EST NON AFRICAN AMERICAN: 79 mL/min/{1.73_m2} (ref >=60)
GLUCOSE: 94 mg/dL (ref 65–99)
Globulin: 2.3 g/dL (calc) (ref 1.9–3.7)
POTASSIUM: 3.8 mmol/L (ref 3.5–5.3)
Sodium: 138 mmol/L (ref 135–146)
TOTAL PROTEIN: 6.9 g/dL (ref 6.1–8.1)

## 2017-08-09 LAB — CBC MORPHOLOGY

## 2017-08-09 LAB — C-REACTIVE PROTEIN: CRP: 1.3 mg/L (ref <8.0)

## 2017-08-09 LAB — SEDIMENTATION RATE: Sed Rate: 2 mm/h (ref 0–15)

## 2017-09-10 ENCOUNTER — Ambulatory Visit (INDEPENDENT_AMBULATORY_CARE_PROVIDER_SITE_OTHER): Payer: Self-pay | Admitting: Pharmacist

## 2017-09-10 DIAGNOSIS — M86422 Chronic osteomyelitis with draining sinus, left humerus: Secondary | ICD-10-CM

## 2017-09-10 MED FILL — rifAMPin 300 MG CAPS: 300 | 30 days supply | Qty: 60 | Fill #1

## 2017-09-10 NOTE — Progress Notes (Signed)
HPI: Jason Maxwell is a 32 y.o. male who presents to the [facility] to pick up his medication.  Patient Active Problem List   Diagnosis Date Noted  . Neutropenia ([place]) 05/29/2017  . Bacteremia due to Klebsiella pneumoniae 04/30/2017  . PICC line infection 04/29/2017  . Microcytic anemia 04/29/2017  . AKI (acute kidney injury) ([place])   . MRSA infection   . Osteomyelitis of left humerus ([place]) 03/12/2017  . Fracture   . Humerus fracture   . 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   ONDANSETRON (ZOFRAN) 4 MG TABLET    Take 1 tablet (4 mg total) by mouth every 8 (eight) hours as needed for nausea or vomiting.   POLYETHYLENE GLYCOL (MIRALAX / GLYCOLAX) PACKET    Take 17 g by mouth daily as needed for mild constipation.   RIFAMPIN (RIFADIN) 300 MG CAPSULE    Take 1 capsule (300 mg total) by mouth 2 (two) times daily.   TEDIZOLID PHOSPHATE 200 MG TABS    Take 200 mg by mouth daily.  Modified Medications   No medications on file  Discontinued Medications   No medications on file    Allergies: Allergies  Allergen Reactions  . Pork-Derived Products     Religious reasons     Past Medical History: Past Medical History:  Diagnosis Date  . Neutropenia ([place]) 05/29/2017    Social History: Social History   Socioeconomic History  . Marital status: Married    Spouse name: Not on file  . Number of children: Not on file  . Years of education: Not on file  . Highest education level: Not on file  Occupational History  . Not on file  Social Needs  . Financial resource strain: Not on file  . Food insecurity:    Worry: Not on file    Inability: Not on file  . Transportation needs:    Medical: Not on file    Non-medical:  Not on file  Tobacco Use  . Smoking status: Former Smoker    Last attempt to quit: 03/12/2015    Years since quitting: 2.5  . Smokeless tobacco: Never Used  Substance and Sexual Activity  . Alcohol use: No  . Drug use: No  . Sexual activity: Not on file  Lifestyle  . Physical activity:    Days per week: Not on file    Minutes per session: Not on file  . Stress: Not on file  Relationships  . Social connections:    Talks on phone: Not on file    Gets together: Not on file    Attends religious service: Not on file    Active member of club or organization: Not on file    Attends meetings of clubs or organizations: Not on file    Relationship status: Not on file  Other Topics Concern  . Not on file  Social History Narrative  . Not on file    Labs: No results found for: HIV1RNAQUANT, HIV1RNAVL, CD4TABS  RPR and STI No results found for: LABRPR, RPRTITER  No flowsheet data found.  Hepatitis B Lab Results  Component Value Date   HEPBSAG Negative 03/13/2017   Hepatitis C No results found for: HEPCAB, HCVRNAPCRQN  Hepatitis A No results found for: HAV Lipids: No results found for: CHOL, TRIG, HDL, CHOLHDL, VLDL, LDLCALC   Assessment: Patient is here today to pick up his tedizolid that he is currently taking for osteomyelitis of his left humerus.  We spoke today via interpreter.  He states he is doing well on the tedizolid and rifampin with no missed doses or side effects.  He states his arm is doing very well. I gave him another bottle of tedizolid and sent him to [facility] to pick up his rifampin, which he did.  Will check labs today - cbc with diff, cmet, ESR, and CRP. I will have him come back and see Dr. Linus Salmons next month since Dr. Tommy Medal will be unavailable.  Plan: - Continue Tedizolid and rifampin - CBC w diff, CMET, ESR, CRP today - F/u with Dr. Linus Salmons  Cassie L. Kuppelweiser, PharmD, [place] [facility], [facility]  for Infectious Disease 09/10/2017, 3:00 PM

## 2017-09-11 LAB — CBC WITH DIFFERENTIAL/PLATELET
BASOS ABS: 41 {cells}/uL (ref 0–200)
Basophils Relative: 1.1 %
EOS ABS: 189 {cells}/uL (ref 15–500)
EOS PCT: 5.1 %
HEMATOCRIT: 43.3 % (ref 38.5–50.0)
HEMOGLOBIN: 14.4 g/dL (ref 13.2–17.1)
LYMPHS ABS: 1998 {cells}/uL (ref 850–3900)
MCH: 20.9 pg — AB (ref 27.0–33.0)
MCHC: 33.3 g/dL (ref 32.0–36.0)
MCV: 62.9 fL — AB (ref 80.0–100.0)
Monocytes Relative: 13.4 %
NEUTROS PCT: 26.4 %
Neutro Abs: 977 cells/uL — ABNORMAL LOW (ref 1500–7800)
Platelets: 227 10*3/uL (ref 140–400)
RBC: 6.88 10*6/uL — ABNORMAL HIGH (ref 4.20–5.80)
RDW: 23.2 % — ABNORMAL HIGH (ref 11.0–15.0)
Total Lymphocyte: 54 %
WBC mixed population: 496 cells/uL (ref 200–950)
WBC: 3.7 10*3/uL — ABNORMAL LOW (ref 3.8–10.8)

## 2017-09-11 LAB — BASIC METABOLIC PANEL
BUN: 12 mg/dL (ref 7–25)
CALCIUM: 9.5 mg/dL (ref 8.6–10.3)
CHLORIDE: 105 mmol/L (ref 98–110)
CO2: 23 mmol/L (ref 20–32)
Creat: 1.25 mg/dL (ref 0.60–1.35)
Glucose, Bld: 95 mg/dL (ref 65–99)
Potassium: 3.8 mmol/L (ref 3.5–5.3)
SODIUM: 139 mmol/L (ref 135–146)

## 2017-09-11 LAB — SEDIMENTATION RATE: SED RATE: 2 mm/h (ref 0–15)

## 2017-09-11 LAB — C-REACTIVE PROTEIN: CRP: 5.2 mg/L (ref <8.0)

## 2017-10-08 ENCOUNTER — Ambulatory Visit (INDEPENDENT_AMBULATORY_CARE_PROVIDER_SITE_OTHER): Payer: Self-pay | Admitting: Internal Medicine

## 2017-10-08 ENCOUNTER — Encounter: Payer: Self-pay | Admitting: Internal Medicine

## 2017-10-08 VITALS — BP 142/85 | HR 65 | Temp 98.5°F | Ht 68.5 in | Wt 182.0 lb

## 2017-10-08 DIAGNOSIS — M86422 Chronic osteomyelitis with draining sinus, left humerus: Secondary | ICD-10-CM

## 2017-10-08 DIAGNOSIS — Z5181 Encounter for therapeutic drug level monitoring: Secondary | ICD-10-CM | POA: Insufficient documentation

## 2017-10-08 NOTE — Assessment & Plan Note (Signed)
Will get him a supply for the next month per pharmacy. He will follow up with Dr. Daiva Eves next month

## 2017-10-08 NOTE — Assessment & Plan Note (Signed)
Will check cmp, cbc 

## 2017-10-08 NOTE — Progress Notes (Signed)
   Subjective:    Patient ID: Jason Maxwell, male    DOB: 11-20-1985, 32 y.o.   MRN: [id number]  HPI Here for follow up of chronic osteomyelitis. He has been on tedizolid and rifampin for a projected 6-12 months after completing ceftarolin for an extended period.  He has been on tedizolid now I believe 5 months and tolerating well.  No symptoms of peripheral neuropathy.  He did have some neutropenia initially but resolved.     Review of Systems  Constitutional: Negative for chills, fatigue and fever.  Gastrointestinal: Negative for diarrhea and nausea.  Skin: Negative for rash.       Objective:   Physical Exam  Constitutional: He appears well-developed and well-nourished.  HENT:  Mouth/Throat: No oropharyngeal exudate.  Eyes: No scleral icterus.  Cardiovascular: Normal rate, regular rhythm and normal heart sounds.  Musculoskeletal:  Left arm with no swelling, no warmth, no erythema          Assessment & Plan:

## 2017-10-09 LAB — COMPLETE METABOLIC PANEL WITH GFR
AG RATIO: 2 (calc) (ref 1.0–2.5)
ALT: 11 U/L (ref 9–46)
AST: 16 U/L (ref 10–40)
Albumin: 4.9 g/dL (ref 3.6–5.1)
Alkaline phosphatase (APISO): 76 U/L (ref 40–115)
BILIRUBIN TOTAL: 0.6 mg/dL (ref 0.2–1.2)
BUN: 13 mg/dL (ref 7–25)
CHLORIDE: 105 mmol/L (ref 98–110)
CO2: 23 mmol/L (ref 20–32)
Calcium: 9.4 mg/dL (ref 8.6–10.3)
Creat: 1.14 mg/dL (ref 0.60–1.35)
GFR, EST AFRICAN AMERICAN: 99 mL/min/{1.73_m2} (ref >=60)
GFR, Est Non African American: 85 mL/min/{1.73_m2} (ref >=60)
Globulin: 2.5 g/dL (calc) (ref 1.9–3.7)
Glucose, Bld: 88 mg/dL (ref 65–99)
POTASSIUM: 3.8 mmol/L (ref 3.5–5.3)
SODIUM: 138 mmol/L (ref 135–146)
Total Protein: 7.4 g/dL (ref 6.1–8.1)

## 2017-10-09 LAB — SEDIMENTATION RATE: SED RATE: 2 mm/h (ref 0–15)

## 2017-10-09 LAB — CBC WITH DIFFERENTIAL/PLATELET
Basophils Absolute: 62 cells/uL (ref 0–200)
Basophils Relative: 1.6 %
EOS PCT: 7.8 %
Eosinophils Absolute: 304 cells/uL (ref 15–500)
HCT: 45.6 % (ref 38.5–50.0)
Hemoglobin: 14.3 g/dL (ref 13.2–17.1)
Lymphs Abs: 1860 cells/uL (ref 850–3900)
MCH: 21.1 pg — ABNORMAL LOW (ref 27.0–33.0)
MCHC: 31.4 g/dL — ABNORMAL LOW (ref 32.0–36.0)
MCV: 67.2 fL — AB (ref 80.0–100.0)
MONOS PCT: 14.5 %
Neutro Abs: 1108 cells/uL — ABNORMAL LOW (ref 1500–7800)
Neutrophils Relative %: 28.4 %
Platelets: 200 10*3/uL (ref 140–400)
RBC: 6.79 10*6/uL — ABNORMAL HIGH (ref 4.20–5.80)
RDW: 23.7 % — ABNORMAL HIGH (ref 11.0–15.0)
TOTAL LYMPHOCYTE: 47.7 %
WBC mixed population: 566 cells/uL (ref 200–950)
WBC: 3.9 10*3/uL (ref 3.8–10.8)

## 2017-10-09 LAB — C-REACTIVE PROTEIN: CRP: 3.6 mg/L (ref <8.0)

## 2017-10-09 MED FILL — rifAMPin 300 MG CAPS: 300 | 30 days supply | Qty: 60 | Fill #2

## 2017-10-16 ENCOUNTER — Telehealth: Payer: Self-pay | Admitting: Pharmacist

## 2017-10-16 NOTE — Telephone Encounter (Signed)
Patient received refill of tedizolid and rifampin on Thursday 9/5. He missed no doses and had no lapse in medication.  Will coordinate refill for his f/u on 10/2.

## 2017-10-17 NOTE — Telephone Encounter (Signed)
Rob thanks for taking care of him

## 2017-10-17 NOTE — Telephone Encounter (Signed)
I will probably stop it after this month.  He is following up with me.

## 2017-10-17 NOTE — Telephone Encounter (Signed)
Ok sounds good. I will hold off on a refill then. Thanks!

## 2017-11-05 ENCOUNTER — Encounter: Payer: Self-pay | Admitting: Internal Medicine

## 2017-11-05 ENCOUNTER — Ambulatory Visit (INDEPENDENT_AMBULATORY_CARE_PROVIDER_SITE_OTHER): Payer: Self-pay | Admitting: Internal Medicine

## 2017-11-05 DIAGNOSIS — Z5181 Encounter for therapeutic drug level monitoring: Secondary | ICD-10-CM

## 2017-11-05 DIAGNOSIS — Z23 Encounter for immunization: Secondary | ICD-10-CM

## 2017-11-05 DIAGNOSIS — M86422 Chronic osteomyelitis with draining sinus, left humerus: Secondary | ICD-10-CM

## 2017-11-05 NOTE — Assessment & Plan Note (Signed)
Doing well and at this point I will have him stop the antibiotic and observe off of treatment.  rtc 4 weeks

## 2017-11-05 NOTE — Progress Notes (Signed)
   Subjective:    Patient ID: Sherri Levenhagen, male    DOB: March 15, 1985, 32 y.o.   MRN: [id number]  HPI Here for follow up of chronic osteomyelitis. He has been on tedizolid and rifampin for a projected 6-12 months after completing ceftarolin for an extended period.  He has been on tedizolid now 6 months and tolerating well.  No symptoms of peripheral neuropathy.  He did have some neutropenia initially but resolved.     Review of Systems  Constitutional: Negative for chills, fatigue and fever.  Gastrointestinal: Negative for diarrhea and nausea.  Skin: Negative for rash.       Objective:   Physical Exam  Constitutional: He appears well-developed and well-nourished.  HENT:  Mouth/Throat: No oropharyngeal exudate.  Eyes: No scleral icterus.  Cardiovascular: Normal rate, regular rhythm and normal heart sounds.  Musculoskeletal:  Left arm with no swelling, no warmth, no erythema          Assessment & Plan:

## 2017-11-05 NOTE — Assessment & Plan Note (Signed)
Wbc, creat wnl

## 2017-12-03 ENCOUNTER — Encounter: Payer: Self-pay | Admitting: Internal Medicine

## 2017-12-03 ENCOUNTER — Ambulatory Visit (INDEPENDENT_AMBULATORY_CARE_PROVIDER_SITE_OTHER): Payer: Self-pay | Admitting: Internal Medicine

## 2017-12-03 DIAGNOSIS — A4902 Methicillin resistant Staphylococcus aureus infection, unspecified site: Secondary | ICD-10-CM

## 2017-12-03 DIAGNOSIS — M86422 Chronic osteomyelitis with draining sinus, left humerus: Secondary | ICD-10-CM

## 2017-12-03 NOTE — Assessment & Plan Note (Signed)
Treated and no further antibiotic treatment indicated.  Discussed return precautions including warmth, swelling and pain.  Otherwise rtc prn

## 2017-12-03 NOTE — Assessment & Plan Note (Signed)
Now resolved infection as above with no further draining.

## 2017-12-03 NOTE — Progress Notes (Signed)
   Subjective:    Patient ID: Haron Beilke, male    DOB: 07/12/1985, 32 y.o.   MRN: [id number]  HPI Here for follow up of chronic osteomyelitis. He has been on tedizolid and rifampin for a projected 6-12 months after completing ceftarolin for an extended period.  He has been on tedizolid now 6 months and tolerating well.  No symptoms of peripheral neuropathy.  He did have some neutropenia initially but resolved.     Review of Systems  Constitutional: Negative for chills, fatigue and fever.  Gastrointestinal: Negative for diarrhea and nausea.  Skin: Negative for rash.       Objective:   Physical Exam  Constitutional: He appears well-developed and well-nourished.  HENT:  Mouth/Throat: No oropharyngeal exudate.  Eyes: No scleral icterus.  Cardiovascular: Normal rate, regular rhythm and normal heart sounds.  Musculoskeletal:  Left arm with no swelling, no warmth, no erythema          Assessment & Plan:

## 2018-11-12 IMAGING — DX DG CHEST 1V PORT
1 series · 1 of 1 positions shown · non-contrast
Comparison: None.

CLINICAL DATA: Cough and fever.

EXAM:
PORTABLE CHEST 1 VIEW

[chest ap]
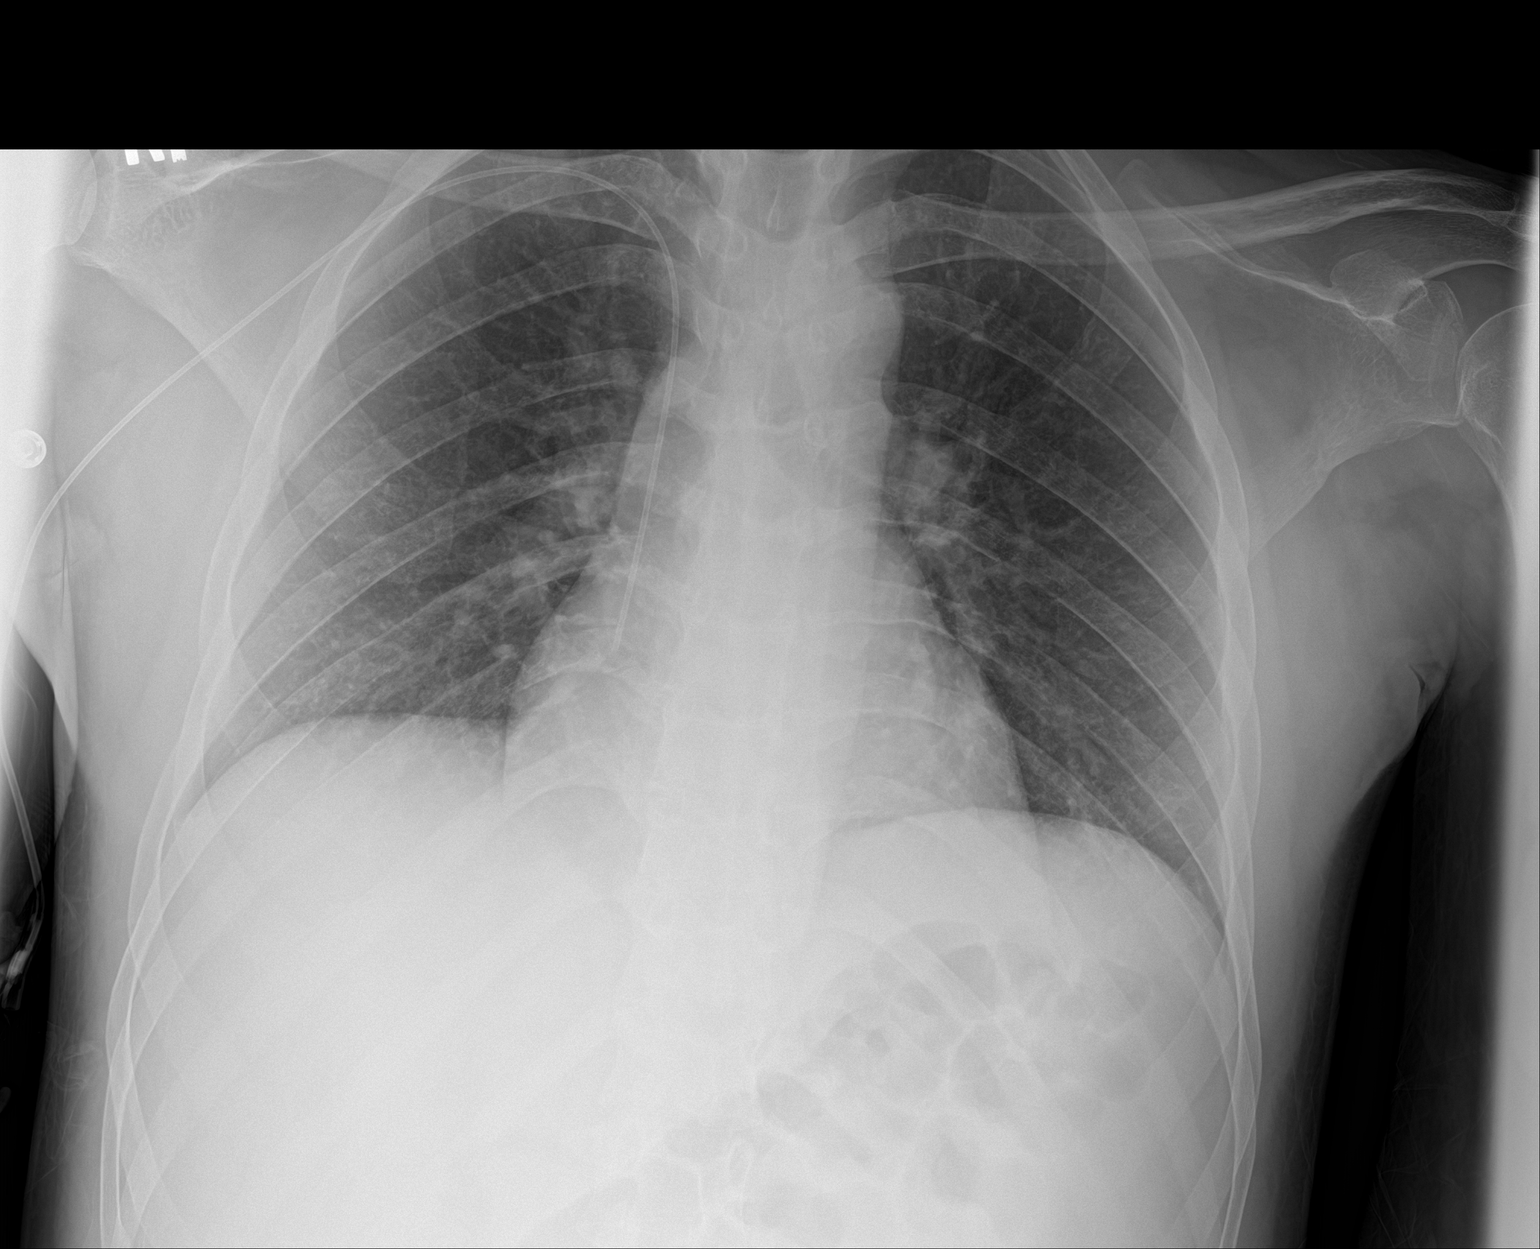

[1 of 1 positions shown; findings below may reference images not displayed]

FINDINGS: Right upper extremity PICC with tip at the upper cavoatrial
junction/upper right atrium. There is no edema, consolidation,
effusion, or pneumothorax. Normal heart size and mediastinal
contours.
IMPRESSION: No evidence of active disease.

## 2018-11-12 IMAGING — DX DG THORACIC SPINE 2V
3 series · 3 of 3 positions shown · non-contrast
Comparison: None.

CLINICAL DATA: Acute back pain.

EXAM:
THORACIC SPINE 2 VIEWS

[t-spine ap]
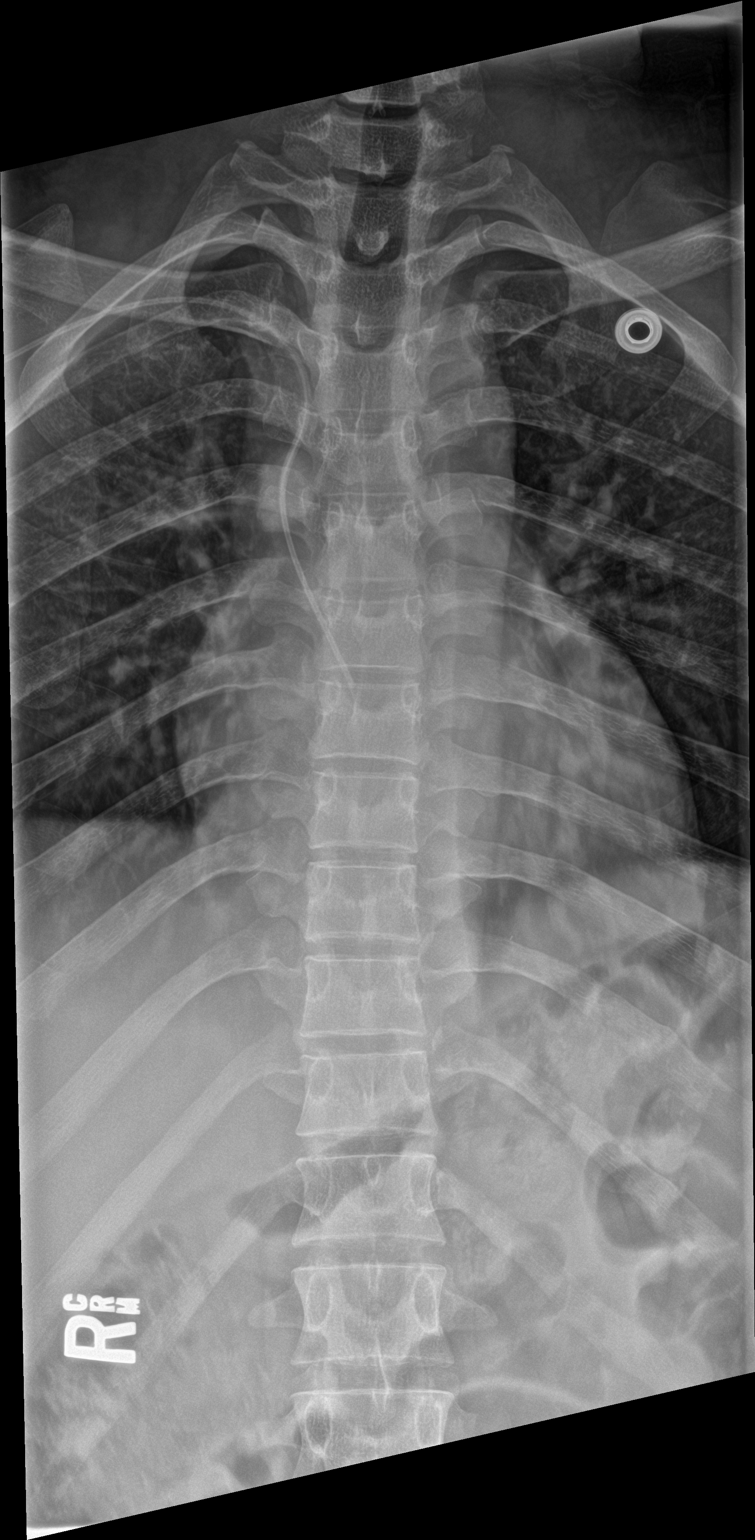

[t-spine lat]
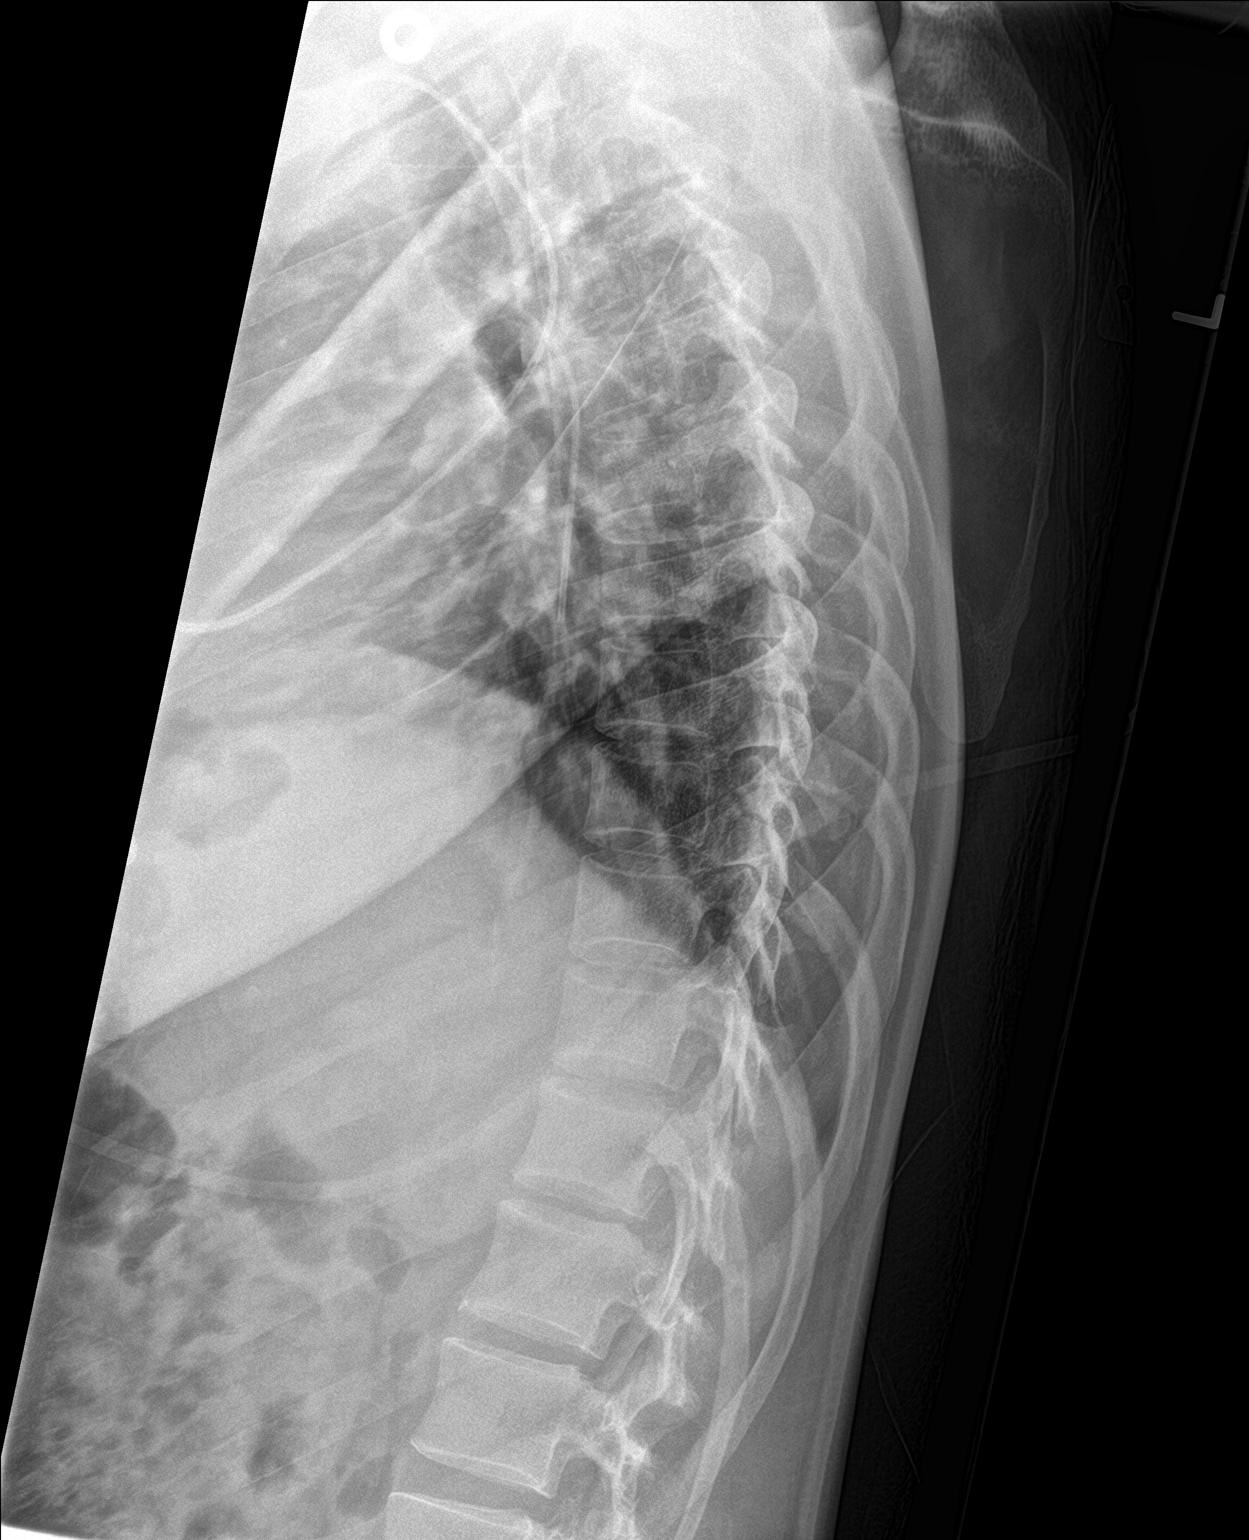

[t-spine swimmers]
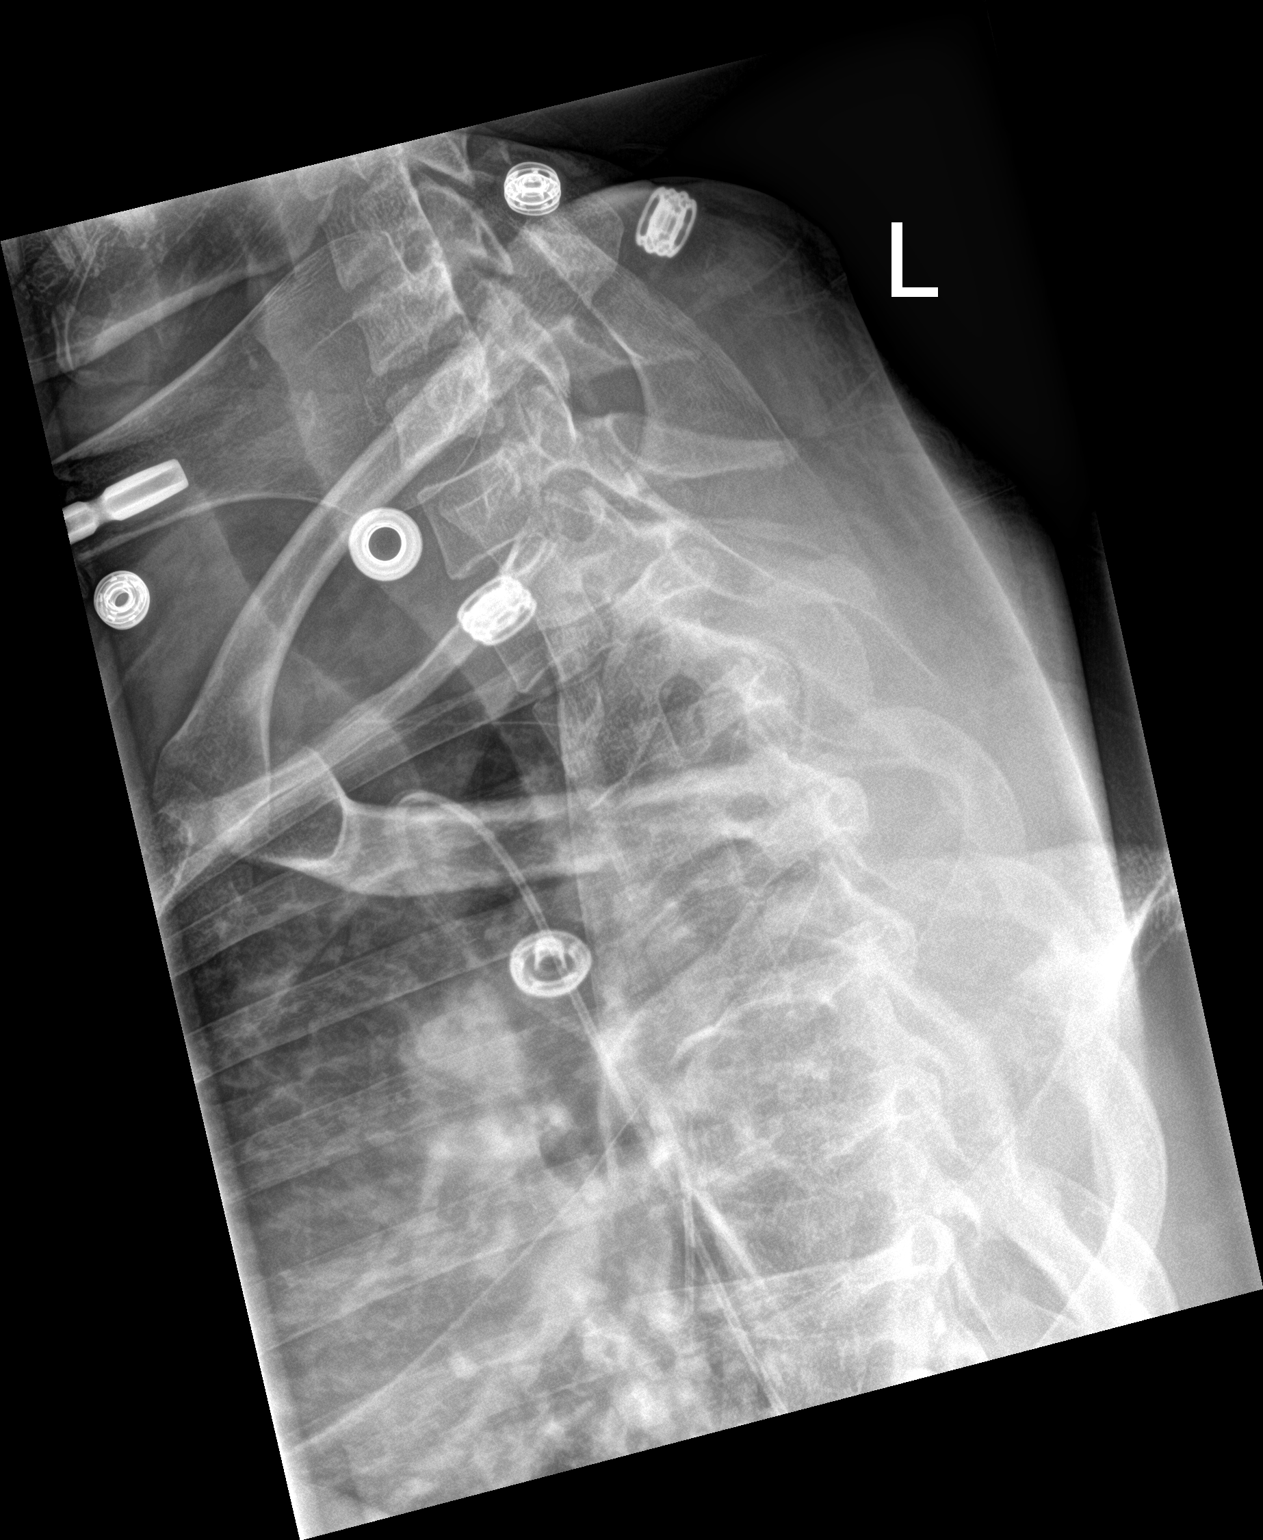

[3 of 3 positions shown; findings below may reference images not displayed]

FINDINGS: There is no evidence of thoracic spine fracture. Alignment is
normal. No other significant bone abnormalities are identified.
IMPRESSION: Normal thoracic spine.

## 2019-02-10 ENCOUNTER — Encounter (HOSPITAL_COMMUNITY): Payer: Self-pay | Admitting: Emergency Medicine

## 2019-02-10 ENCOUNTER — Other Ambulatory Visit: Payer: Self-pay

## 2019-02-10 ENCOUNTER — Ambulatory Visit (INDEPENDENT_AMBULATORY_CARE_PROVIDER_SITE_OTHER): Payer: Self-pay

## 2019-02-10 ENCOUNTER — Ambulatory Visit (HOSPITAL_COMMUNITY)
Admission: EM | Admit: 2019-02-10 | Discharge: 2019-02-10 | Disposition: A | Discharge status: Home / Self Care | Payer: Self-pay | Attending: Family Medicine | Admitting: Family Medicine

## 2019-02-10 DIAGNOSIS — Z91018 Allergy to other foods: Secondary | ICD-10-CM | POA: Insufficient documentation

## 2019-02-10 DIAGNOSIS — Z87891 Personal history of nicotine dependence: Secondary | ICD-10-CM | POA: Insufficient documentation

## 2019-02-10 DIAGNOSIS — K5900 Constipation, unspecified: Secondary | ICD-10-CM

## 2019-02-10 DIAGNOSIS — Z79899 Other long term (current) drug therapy: Secondary | ICD-10-CM | POA: Insufficient documentation

## 2019-02-10 DIAGNOSIS — R071 Chest pain on breathing: Secondary | ICD-10-CM | POA: Insufficient documentation

## 2019-02-10 DIAGNOSIS — R05 Cough: Secondary | ICD-10-CM | POA: Insufficient documentation

## 2019-02-10 DIAGNOSIS — R1111 Vomiting without nausea: Secondary | ICD-10-CM | POA: Insufficient documentation

## 2019-02-10 DIAGNOSIS — Z20822 Contact with and (suspected) exposure to covid-19: Secondary | ICD-10-CM | POA: Insufficient documentation

## 2019-02-10 DIAGNOSIS — M549 Dorsalgia, unspecified: Secondary | ICD-10-CM | POA: Insufficient documentation

## 2019-02-10 MED ORDER — ONDANSETRON 4 MG PO TBDP: 4.0000 mg | ORAL_TABLET | Freq: Three times a day (TID) | ORAL | 0 refills | Status: DC | PRN

## 2019-02-10 MED ORDER — DOCUSATE SODIUM 100 MG PO CAPS: 100.0000 mg | ORAL_CAPSULE | Freq: Two times a day (BID) | ORAL | 0 refills | Status: DC

## 2019-02-10 MED ORDER — POLYETHYLENE GLYCOL 3350 17 G PO PACK: 17.0000 g | PACK | Freq: Every day | ORAL | 0 refills | Status: DC

## 2019-02-10 NOTE — ED Provider Notes (Signed)
[facility]    CSN: [id number] Arrival date & time: 02/10/19  1455      History   Chief Complaint Chief Complaint  Patient presents with  . Emesis    HPI Jason Maxwell is a 34 y.o. male.   Jason Maxwell presents with complaints of vomiting after eating, which started yesterday. Hasn't eaten anything today. Has drank coffee today and has vomited it up as well. Today vomited twice. Whatever he eats/drinks he vomits, non bloody/bilious. Drank water this morning. No stomach pain. Constipated for the past 3-5 days. Some cough, it is occasional. Some back pain with deep inspiration. This started yesterday. No sore throat or nasal congestion. No fevers. Has had issues with constipation in the past. Hasn't taken any medications for his constipation. No known ill contacts.    ROS per HPI, negative if not otherwise mentioned.      Past Medical History:  Diagnosis Date  . Neutropenia (HCC) 05/29/2017    Patient Active Problem List   Diagnosis Date Noted  . Medication monitoring encounter 10/08/2017  . Microcytic anemia 04/29/2017  . MRSA infection   . 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 Humerus fracture   . 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 arm surgery Left   . FRACTURE SURGERY    . HERNIA REPAIR Right    inguinal   . HUMERUS IM NAIL Left 03/12/2017   Procedure: INTRAMEDULLARY (IM) NAIL LEFT HUMERAL;  Surgeon: Roby Lofts, MD;  Location: [facility];  Service: Orthopedics;  Laterality: Left;  . IM NAILING HUMERUS Left 03/12/2017  . INCISION AND DRAINAGE OF WOUND Left 03/12/2017   Procedure: IRRIGATION AND DEBRIDEMENT LEFT HUMEROUS WOUND;  Surgeon: Roby Lofts, MD;  Location: [facility];  Service: Orthopedics;  Laterality: Left;       Home Medications    Prior to Admission medications   Medication Sig Start Date End Date Taking? Authorizing  Provider  acetaminophen (TYLENOL) 500 MG tablet Take 1 tablet (500 mg total) by mouth every 6 (six) hours as needed. 04/28/17   Georgetta Haber, NP  docusate sodium (COLACE) 100 MG capsule Take 1 capsule (100 mg total) by mouth every 12 (twelve) hours. 02/10/19   Georgetta Haber, NP  ondansetron (ZOFRAN) 4 MG tablet Take 1 tablet (4 mg total) by mouth every 8 (eight) hours as needed for nausea or vomiting. 04/28/17   Georgetta Haber, NP  ondansetron (ZOFRAN-ODT) 4 MG disintegrating tablet Take 1 tablet (4 mg total) by mouth every 8 (eight) hours as needed for nausea or vomiting. 02/10/19   Georgetta Haber, NP  polyethylene glycol (MIRALAX / GLYCOLAX) 17 g packet Take 17 g by mouth daily. 02/10/19   Georgetta Haber, NP    Family History No family history on file.  Social History Social History   Tobacco Use  . Smoking status: Former Smoker    Quit date: 03/12/2015    Years since quitting: 3.9  . Smokeless tobacco: Never Used  Substance Use Topics  . Alcohol use: No  . Drug use: No     Allergies   Pork-derived products   Review of Systems Review of Systems   Physical Exam Triage Vital Signs ED Triage Vitals  Enc Vitals Group     BP --      Pulse Rate 02/10/19 1620 81     Resp 02/10/19  1620 16     Temp 02/10/19 1620 98.6 F (37 C)     Temp Source 02/10/19 1620 Oral     SpO2 02/10/19 1620 100 %     Weight --      Height --      Head Circumference --      Peak Flow --      Pain Score 02/10/19 1615 2     Pain Loc --      Pain Edu? --      Excl. in GC? --    No data found.  Updated Vital Signs Pulse 81   Temp 98.6 F (37 C) (Oral)   Resp 16   SpO2 100%   Visual Acuity Right Eye Distance:   Left Eye Distance:   Bilateral Distance:    Right Eye Near:   Left Eye Near:    Bilateral Near:     Physical Exam Constitutional:      Appearance: He is well-developed.  Cardiovascular:     Rate and Rhythm: Normal rate.  Pulmonary:     Effort: Pulmonary effort is  normal.  Abdominal:     General: There is no distension.     Tenderness: There is no abdominal tenderness. There is no right CVA tenderness, left CVA tenderness or rebound.  Skin:    General: Skin is warm and dry.  Neurological:     Mental Status: He is alert and oriented to person, place, and time.      UC Treatments / Results  Labs (all labs ordered are listed, but only abnormal results are displayed) Labs Reviewed  NOVEL CORONAVIRUS, NAA (HOSP ORDER, SEND-OUT TO REF LAB; TAT 18-24 HRS)    EKG   Radiology DG Abd Acute W/Chest  Result Date: 02/10/2019 CLINICAL DATA:  Per pt: sick for three days, N/V, no diarrhea, no fever. Can't ear anything without vomiting, can drink water. Abdominal bloating. Appendix removed 20+ years ago. No history of cardiac or respiratory disease. Ex smoker for 3year.*comment was truncated*cough, cp, vomiting EXAM: DG ABDOMEN ACUTE W/ 1V CHEST COMPARISON:  None available FINDINGS: Normal mediastinum and cardiac silhouette. Normal pulmonary vasculature. No evidence of effusion, infiltrate, or pneumothorax. No acute bony abnormality. Moderate volume stool in the ascending, transverse and descending colon. Gas and stool in the rectum. No bowel obstruction. No pathologic calcifications. No organomegaly. No acute osseous abnormality. IMPRESSION: 1. No acute cardiopulmonary process. 2. Moderate volume stool throughout the colon. Correlate clinically for mild constipation. Electronically Signed   By: Genevive Bi M.D.   On: 02/10/2019 17:26    Procedures Procedures (including critical care time)  Medications Ordered in UC Medications - No data to display  Initial Impression / Assessment and Plan / UC Course  I have reviewed the triage vital signs and the nursing notes.  Pertinent labs & imaging results that were available during my care of the patient were reviewed by me and considered in my medical decision making (see chart for details).     Non toxic.  Benign physical exam.  Afebrile.no tachycardia. Benign abdomen. Xray consistent with constipation. Treatment and prevention discussed. covid testing is pending as well. Return precautions provided. Patient verbalized understanding and agreeable to plan.   Final Clinical Impressions(s) / UC Diagnoses   Final diagnoses:  Constipation, unspecified constipation type     Discharge Instructions     It appears that your symptoms are related to constipation.  Please use miralax daily, drink with water. Increase the water you  are drinking as well as the fiber in your diet.  May use colace capsule twice a day as a stool softener to prevent constipation.  Zofran as needed for nausea, limit this as able as it can cause constipation as well.  If you develop pain, worsening of vomiting, dehydration, or no bowel movement please return to be seen or go to the ER.     ED Prescriptions    Medication Sig Dispense Auth. Provider   docusate sodium (COLACE) 100 MG capsule Take 1 capsule (100 mg total) by mouth every 12 (twelve) hours. 60 capsule Johann Santone B, NP   polyethylene glycol (MIRALAX / GLYCOLAX) 17 g packet Take 17 g by mouth daily. 30 each Augusto Gamble B, NP   ondansetron (ZOFRAN-ODT) 4 MG disintegrating tablet Take 1 tablet (4 mg total) by mouth every 8 (eight) hours as needed for nausea or vomiting. 12 tablet Zigmund Gottron, NP     PDMP not reviewed this encounter.   Zigmund Gottron, NP 02/10/19 2009

## 2019-02-10 NOTE — Discharge Instructions (Signed)
It appears that your symptoms are related to constipation.  Please use miralax daily, drink with water. Increase the water you are drinking as well as the fiber in your diet.  May use colace capsule twice a day as a stool softener to prevent constipation.  Zofran as needed for nausea, limit this as able as it can cause constipation as well.  If you develop pain, worsening of vomiting, dehydration, or no bowel movement please return to be seen or go to the ER.

## 2019-02-10 NOTE — ED Triage Notes (Signed)
PT ate a meal 2 days ago. He started vomiting an hour late. He vomited every time he tried to eat something. He was able to sleep and hold down water. He was able to eat yesterday, but began vomiting again last night. Reports left sided pain that is worse with deep breaths and pain over left side of back that is worse with deep breath. Some coughing.   No fever.

## 2019-02-12 LAB — NOVEL CORONAVIRUS, NAA (HOSP ORDER, SEND-OUT TO REF LAB; TAT 18-24 HRS): SARS-CoV-2, NAA: NOT DETECTED

## 2019-04-16 ENCOUNTER — Other Ambulatory Visit: Payer: Self-pay

## 2019-04-16 ENCOUNTER — Encounter (HOSPITAL_COMMUNITY): Payer: Self-pay

## 2019-04-16 ENCOUNTER — Ambulatory Visit (HOSPITAL_COMMUNITY)
Admission: EM | Admit: 2019-04-16 | Discharge: 2019-04-16 | Disposition: A | Discharge status: Home / Self Care | Payer: Self-pay | Attending: Family Medicine | Admitting: Family Medicine

## 2019-04-16 DIAGNOSIS — R300 Dysuria: Secondary | ICD-10-CM

## 2019-04-16 LAB — POCT URINALYSIS DIP (DEVICE)
Bilirubin Urine: NEGATIVE
Glucose, UA: NEGATIVE mg/dL
Hgb urine dipstick: NEGATIVE
Ketones, ur: NEGATIVE mg/dL
Leukocytes,Ua: NEGATIVE
Nitrite: NEGATIVE
Protein, ur: NEGATIVE mg/dL
Specific Gravity, Urine: >=1.03 (ref 1.005–1.030)
Urobilinogen, UA: 0.2 mg/dL (ref 0.0–1.0)
pH: 6.5 (ref 5.0–8.0)

## 2019-04-16 MED ORDER — DOXYCYCLINE HYCLATE 100 MG PO TABS: 100.0000 mg | ORAL_TABLET | Freq: Two times a day (BID) | ORAL | 0 refills | Status: DC

## 2019-04-16 NOTE — ED Triage Notes (Signed)
Pt is here with groin pain that started 8 days ago, pt has taken AZO to relieve discomfort.

## 2019-04-16 NOTE — ED Provider Notes (Signed)
[facility]    CSN: [id number] Arrival date & time: 04/16/19  1150      History   Chief Complaint Chief Complaint  Patient presents with  . Groin Pain    HPI Jason Maxwell is a 34 y.o. male.   34 yo established [facility] patient presenting with dysuria when finishing urination for 8 days.  No discharge or penile lesions.  Notes some suprapubic soreness.  No fever, hematuria, penile discharge.  Works at [employer]     Past Medical History:  Diagnosis Date  . Neutropenia (HCC) 05/29/2017    Patient Active Problem List   Diagnosis Date Noted  . Medication monitoring encounter 10/08/2017  . Microcytic anemia 04/29/2017  . MRSA infection   . 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 Humerus fracture   . 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 arm surgery Left   . FRACTURE SURGERY    . HERNIA REPAIR Right    inguinal   . HUMERUS IM NAIL Left 03/12/2017   Procedure: INTRAMEDULLARY (IM) NAIL LEFT HUMERAL;  Surgeon: Roby Lofts, MD;  Location: [facility];  Service: Orthopedics;  Laterality: Left;  . IM NAILING HUMERUS Left 03/12/2017  . INCISION AND DRAINAGE OF WOUND Left 03/12/2017   Procedure: IRRIGATION AND DEBRIDEMENT LEFT HUMEROUS WOUND;  Surgeon: Roby Lofts, MD;  Location: [facility];  Service: Orthopedics;  Laterality: Left;       Home Medications    Prior to Admission medications   Medication Sig Start Date End Date Taking? Authorizing Provider  docusate sodium (COLACE) 100 MG capsule Take 1 capsule (100 mg total) by mouth every 12 (twelve) hours. 02/10/19   Georgetta Haber, NP  doxycycline (VIBRA-TABS) 100 MG tablet Take 1 tablet (100 mg total) by mouth 2 (two) times daily. 04/16/19   Elvina Sidle, MD  polyethylene glycol (MIRALAX / GLYCOLAX) 17 g packet Take 17 g by mouth daily. 02/10/19   Georgetta Haber, NP    Family History Family  History  Problem Relation Age of Onset  . Healthy Mother   . Hypertension Father     Social History Social History   Tobacco Use  . Smoking status: Former Smoker    Quit date: 03/12/2015    Years since quitting: 4.0  . Smokeless tobacco: Never Used  Substance Use Topics  . Alcohol use: No  . Drug use: No     Allergies   Pork-derived products   Review of Systems Review of Systems  Genitourinary: Positive for dysuria.     Physical Exam Triage Vital Signs ED Triage Vitals  Enc Vitals Group     BP 04/16/19 1229 129/77     Pulse Rate 04/16/19 1229 71     Resp 04/16/19 1229 18     Temp 04/16/19 1229 98.7 F (37.1 C)     Temp Source 04/16/19 1229 Oral     SpO2 04/16/19 1229 98 %     Weight 04/16/19 1225 198 lb 9.6 oz (90.1 kg)     Height --      Head Circumference --      Peak Flow --      Pain Score 04/16/19 1225 7     Pain Loc --      Pain Edu? --      Excl. in GC? --    No data found.  Updated Vital  Signs BP 129/77 (BP Location: Right Arm)   Pulse 71   Temp 98.7 F (37.1 C) (Oral)   Resp 18   Wt 90.1 kg   SpO2 98%   BMI 30.20 kg/m    Physical Exam Vitals and nursing note reviewed.  Constitutional:      Appearance: Normal appearance.  Pulmonary:     Effort: Pulmonary effort is normal.  Abdominal:     General: Abdomen is flat.     Tenderness: There is abdominal tenderness.     Comments: Mild suprapubic tenderness.  Genitourinary:    Penis: Normal.   Musculoskeletal:        General: Deformity present.  Skin:    General: Skin is dry.     Comments: Marked scarring and deformity left forearm and elbow (chronic)  Neurological:     General: No focal deficit present.     Mental Status: He is alert.  Psychiatric:        Mood and Affect: Mood normal.      UC Treatments / Results  Labs (all labs ordered are listed, but only abnormal results are displayed) Labs Reviewed  POCT URINALYSIS DIP (DEVICE)    EKG   Radiology No results  found.  Procedures Procedures (including critical care time)  Medications Ordered in UC Medications - No data to display  Initial Impression / Assessment and Plan / UC Course  I have reviewed the triage vital signs and the nursing notes.  Pertinent labs & imaging results that were available during my care of the patient were reviewed by me and considered in my medical decision making (see chart for details).    Final Clinical Impressions(s) / UC Diagnoses   Final diagnoses:  Dysuria     Discharge Instructions     Return if symptoms persist.    ED Prescriptions    Medication Sig Dispense Auth. Provider   doxycycline (VIBRA-TABS) 100 MG tablet Take 1 tablet (100 mg total) by mouth 2 (two) times daily. 14 tablet Robyn Haber, MD     I have reviewed the PDMP during this encounter.   Robyn Haber, MD 04/16/19 1308

## 2019-04-16 NOTE — Discharge Instructions (Addendum)
Return if symptoms persist 

## 2019-07-30 ENCOUNTER — Ambulatory Visit: Payer: Self-pay | Attending: Internal Medicine

## 2019-07-30 DIAGNOSIS — Z20822 Contact with and (suspected) exposure to covid-19: Secondary | ICD-10-CM

## 2019-07-31 LAB — SARS-COV-2, NAA 2 DAY TAT

## 2019-07-31 LAB — NOVEL CORONAVIRUS, NAA: SARS-CoV-2, NAA: NOT DETECTED

## 2019-10-08 ENCOUNTER — Telehealth: Payer: Self-pay | Admitting: Physical Medicine and Rehabilitation

## 2019-10-08 NOTE — Telephone Encounter (Signed)
01/01/2017 EMG/NCV faxed to [facility] fax [phone], ph [phone]

## 2020-04-25 ENCOUNTER — Encounter (HOSPITAL_COMMUNITY): Payer: Self-pay

## 2020-04-25 ENCOUNTER — Emergency Department (HOSPITAL_COMMUNITY): Payer: BLUE CROSS/BLUE SHIELD

## 2020-04-25 ENCOUNTER — Emergency Department (HOSPITAL_COMMUNITY)
Admission: EM | Admit: 2020-04-25 | Discharge: 2020-04-26 | Disposition: A | Discharge status: Home / Self Care | Payer: BLUE CROSS/BLUE SHIELD | Attending: Emergency Medicine | Admitting: Emergency Medicine

## 2020-04-25 ENCOUNTER — Other Ambulatory Visit: Payer: Self-pay

## 2020-04-25 ENCOUNTER — Ambulatory Visit (HOSPITAL_COMMUNITY)
Admission: EM | Admit: 2020-04-25 | Discharge: 2020-04-25 | Disposition: A | Discharge status: Home / Self Care | Payer: BLUE CROSS/BLUE SHIELD | Attending: Internal Medicine | Admitting: Internal Medicine

## 2020-04-25 DIAGNOSIS — R0602 Shortness of breath: Secondary | ICD-10-CM | POA: Insufficient documentation

## 2020-04-25 DIAGNOSIS — R059 Cough, unspecified: Secondary | ICD-10-CM | POA: Diagnosis not present

## 2020-04-25 DIAGNOSIS — Z87891 Personal history of nicotine dependence: Secondary | ICD-10-CM | POA: Diagnosis not present

## 2020-04-25 DIAGNOSIS — R072 Precordial pain: Secondary | ICD-10-CM | POA: Diagnosis present

## 2020-04-25 DIAGNOSIS — R079 Chest pain, unspecified: Secondary | ICD-10-CM | POA: Diagnosis not present

## 2020-04-25 LAB — BASIC METABOLIC PANEL
Anion gap: 10 (ref 5–15)
BUN: 12 mg/dL (ref 6–20)
CO2: 24 mmol/L (ref 22–32)
Calcium: 9.8 mg/dL (ref 8.9–10.3)
Chloride: 105 mmol/L (ref 98–111)
Creatinine, Ser: 1.14 mg/dL (ref 0.61–1.24)
GFR, Estimated: >60 mL/min (ref >60)
Glucose, Bld: 98 mg/dL (ref 70–99)
Potassium: 4.3 mmol/L (ref 3.5–5.1)
Sodium: 139 mmol/L (ref 135–145)

## 2020-04-25 LAB — TROPONIN I (HIGH SENSITIVITY)
Troponin I (High Sensitivity): 4 ng/L (ref <18)
Troponin I (High Sensitivity): 2 ng/L (ref <18)

## 2020-04-25 LAB — CBC
HCT: 51.9 % (ref 39.0–52.0)
Hemoglobin: 18.3 g/dL — ABNORMAL HIGH (ref 13.0–17.0)
MCH: 27.3 pg (ref 26.0–34.0)
MCHC: 35.3 g/dL (ref 30.0–36.0)
MCV: 77.5 fL — ABNORMAL LOW (ref 80.0–100.0)
Platelets: 274 10*3/uL (ref 150–400)
RBC: 6.7 MIL/uL — ABNORMAL HIGH (ref 4.22–5.81)
RDW: 14.4 % (ref 11.5–15.5)
WBC: 4 10*3/uL (ref 4.0–10.5)
nRBC: 0 % (ref 0.0–0.2)

## 2020-04-25 NOTE — ED Triage Notes (Signed)
Pt informed Providers will se him but if a Cardiac work needs to be done it will need to be done in ED.

## 2020-04-25 NOTE — ED Provider Notes (Signed)
[facility]    CSN: [id number] Arrival date & time: 04/25/20  1352      History   Chief Complaint Chief Complaint  Patient presents with  . Chest Pain    LT    HPI Jason Maxwell is a 35 y.o. male.   Patient declines an interpreter.  He presents with 1 week history of intermittent chest pain and shortness of breath.  He indicates the chest pain is midsternal.  He also states the chest pain radiates to his back.  He denies falls or injury.  He denies diaphoresis, nausea, vomiting, dizziness, numbness, weakness, or other symptoms.  His medical history includes osteomyelitis of left humerus, MRSA infection, microcytic anemia, neutropenia.  The history is provided by the patient and medical records.    Past Medical History:  Diagnosis Date  . Neutropenia (HCC) 05/29/2017    Patient Active Problem List   Diagnosis Date Noted  . Medication monitoring encounter 10/08/2017  . Microcytic anemia 04/29/2017  . MRSA infection   . 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 Humerus fracture   . 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 arm surgery Left   . FRACTURE SURGERY    . HERNIA REPAIR Right    inguinal   . HUMERUS IM NAIL Left 03/12/2017   Procedure: INTRAMEDULLARY (IM) NAIL LEFT HUMERAL;  Surgeon: Roby Lofts, MD;  Location: [facility];  Service: Orthopedics;  Laterality: Left;  . IM NAILING HUMERUS Left 03/12/2017  . INCISION AND DRAINAGE OF WOUND Left 03/12/2017   Procedure: IRRIGATION AND DEBRIDEMENT LEFT HUMEROUS WOUND;  Surgeon: Roby Lofts, MD;  Location: [facility];  Service: Orthopedics;  Laterality: Left;       Home Medications    Prior to Admission medications   Medication Sig Start Date End Date Taking? Authorizing Provider  docusate sodium (COLACE) 100 MG capsule Take 1 capsule (100 mg total) by mouth every 12 (twelve) hours. 02/10/19   Georgetta Haber, NP  doxycycline (VIBRA-TABS) 100 MG tablet Take 1 tablet (100 mg total) by mouth 2 (two) times daily. 04/16/19   Elvina Sidle, MD  polyethylene glycol (MIRALAX / GLYCOLAX) 17 g packet Take 17 g by mouth daily. 02/10/19   Georgetta Haber, NP    Family History Family History  Problem Relation Age of Onset  . Healthy Mother   . Hypertension Father     Social History Social History   Tobacco Use  . Smoking status: Former Smoker    Quit date: 03/12/2015    Years since quitting: 5.1  . Smokeless tobacco: Never Used  Vaping Use  . Vaping Use: Never used  Substance Use Topics  . Alcohol use: No  . Drug use: No     Allergies   Pork-derived products   Review of Systems Review of Systems  Constitutional: Negative for chills and fever.  HENT: Negative for ear pain and sore throat.   Eyes: Negative for pain and visual disturbance.  Respiratory: Positive for shortness of breath. Negative for cough.   Cardiovascular: Positive for chest pain. Negative for palpitations.  Gastrointestinal: Negative for abdominal pain and vomiting.  Genitourinary: Negative for dysuria and hematuria.  Musculoskeletal: Positive for back pain. Negative for arthralgias.  Skin: Negative for color change and rash.  Neurological: Negative for seizures and syncope.  All other systems reviewed and are negative.    Physical  Exam Triage Vital Signs ED Triage Vitals  Enc Vitals Group     BP 04/25/20 1440 128/87     Pulse Rate 04/25/20 1440 78     Resp 04/25/20 1440 18     Temp 04/25/20 1440 98.8 F (37.1 C)     Temp Source 04/25/20 1440 Oral     SpO2 04/25/20 1440 97 %     Weight --      Height --      Head Circumference --      Peak Flow --      Pain Score 04/25/20 1441 6     Pain Loc --      Pain Edu? --      Excl. in GC? --    No data found.  Updated Vital Signs BP 128/87 (BP Location: Right Arm)   Pulse 78   Temp 98.8 F (37.1 C) (Oral)   Resp 18   SpO2 97%   Visual  Acuity Right Eye Distance:   Left Eye Distance:   Bilateral Distance:    Right Eye Near:   Left Eye Near:    Bilateral Near:     Physical Exam Vitals and nursing note reviewed.  Constitutional:      General: He is not in acute distress.    Appearance: He is well-developed. He is not ill-appearing.  HENT:     Head: Normocephalic and atraumatic.     Mouth/Throat:     Mouth: Mucous membranes are moist.  Eyes:     Conjunctiva/sclera: Conjunctivae normal.  Cardiovascular:     Rate and Rhythm: Normal rate and regular rhythm.     Heart sounds: Normal heart sounds.  Pulmonary:     Effort: Pulmonary effort is normal. No respiratory distress.     Breath sounds: Normal breath sounds.  Abdominal:     Palpations: Abdomen is soft.     Tenderness: There is no abdominal tenderness.  Musculoskeletal:     Cervical back: Neck supple.  Skin:    General: Skin is warm and dry.  Neurological:     General: No focal deficit present.     Mental Status: He is alert.     Gait: Gait normal.  Psychiatric:        Mood and Affect: Mood normal.        Behavior: Behavior normal.      UC Treatments / Results  Labs (all labs ordered are listed, but only abnormal results are displayed) Labs Reviewed - No data to display  EKG   Radiology No results found.  Procedures Procedures (including critical care time)  Medications Ordered in UC Medications - No data to display  Initial Impression / Assessment and Plan / UC Course  I have reviewed the triage vital signs and the nursing notes.  Pertinent labs & imaging results that were available during my care of the patient were reviewed by me and considered in my medical decision making (see chart for details).   Chest pain, shortness of breath.  EKG shows sinus rhythm, rate 84, no ST elevation, no previous to compare.  Patient reports no cardiac history.  Former smoker.  Discussed limitations of evaluation of chest pain and an urgent care  setting.  Sending him to the ED for evaluation.   Final Clinical Impressions(s) / UC Diagnoses   Final diagnoses:  Chest pain, unspecified type  Shortness of breath     Discharge Instructions     Go to the emergency department for  evaluation of your chest pain and shortness of breath.    ED Prescriptions    None     PDMP not reviewed this encounter.   Mickie Bail, NP 04/25/20 [phone]

## 2020-04-25 NOTE — ED Notes (Signed)
Patient is being discharged from the Urgent Care and sent to the Emergency Department via private vehicle. Per Wendee Beavers, APP, patient is in need of higher level of care due to chest pain with shortness of breath. Patient is aware and verbalizes understanding of plan of care.  Vitals:   04/25/20 1440  BP: 128/87  Pulse: 78  Resp: 18  Temp: 98.8 F (37.1 C)  SpO2: 97%

## 2020-04-25 NOTE — ED Triage Notes (Signed)
Pt reports CP that radiates  to back  On Lt side for 3 days . Pt also report SHOB.

## 2020-04-25 NOTE — Discharge Instructions (Addendum)
Go to the emergency department for evaluation of your chest pain and shortness of breath.    

## 2020-04-25 NOTE — ED Triage Notes (Signed)
Pt reports intermitted chest pain/ cough  that radiates to his upper bag started last week.Pt denies n/v/d & sob

## 2020-04-26 MED ORDER — OMEPRAZOLE 20 MG PO CPDR: 20.0000 mg | DELAYED_RELEASE_CAPSULE | Freq: Every day | ORAL | 0 refills | Status: AC

## 2020-04-26 NOTE — Discharge Instructions (Signed)

## 2020-04-26 NOTE — ED Notes (Addendum)
Introduced self. Pt reports acute onset generalized chest and back pain beginning last week while walking, denies injury, and reports that pain has come and gone since then. Pt endorses non productive cough. Pt is well appearing, calm, and cooperative. Will continue to monitor.

## 2020-04-26 NOTE — ED Provider Notes (Signed)
[facility] EMERGENCY DEPARTMENT Provider Note   CSN: [id number] Arrival date & time: 04/25/20  1513     History Chief Complaint  Patient presents with  . Chest Pain    Jason Maxwell is a 35 y.o. male.  The history is provided by the patient.  Chest Pain Pain location:  Substernal area Pain quality: sharp   Pain radiates to:  Upper back Pain severity:  Moderate Onset quality:  Gradual Duration:  1 week Timing:  Intermittent Chronicity:  New Relieved by: swallowing. Worsened by:  Deep breathing Associated symptoms: cough and shortness of breath   Associated symptoms: no abdominal pain, no diaphoresis, no fever and no vomiting   Associated symptoms comment:  Denies hemoptysis  Risk factors: no coronary artery disease, no prior DVT/PE and no smoking     HPI: A 35 year old patient presents for evaluation of chest pain. Initial onset of pain was more than 6 hours ago. The patient's chest pain is sharp and is not worse with exertion. The patient's chest pain is middle- or left-sided, is not well-localized, is not described as heaviness/pressure/tightness and does not radiate to the arms/jaw/neck. The patient does not complain of nausea and denies diaphoresis. The patient has no history of stroke, has no history of peripheral artery disease, has not smoked in the past 90 days, denies any history of treated diabetes, has no relevant family history of coronary artery disease (first degree relative at less than age 78), is not hypertensive, has no history of hypercholesterolemia and does not have an elevated BMI (>=30).    Patient has had chest pain intermittently for 1 week.  At times it is worse with breathing.  It seems to be relieved with swallowing He is chest pain-free at this time  Previous history of traumatic injury while living in the [facility] with chronic back pain as well as left arm deformity  Patient declines [place] interpreter and prefers to  speak in [place] Past Medical History:  Diagnosis Date  . Neutropenia (HCC) 05/29/2017    Patient Active Problem List   Diagnosis Date Noted  . Medication monitoring encounter 10/08/2017  . Microcytic anemia 04/29/2017  . MRSA infection   . 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 Humerus fracture   . 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 arm surgery Left   . FRACTURE SURGERY    . HERNIA REPAIR Right    inguinal   . HUMERUS IM NAIL Left 03/12/2017   Procedure: INTRAMEDULLARY (IM) NAIL LEFT HUMERAL;  Surgeon: Roby Lofts, MD;  Location: [facility] OR;  Service: Orthopedics;  Laterality: Left;  . IM NAILING HUMERUS Left 03/12/2017  . INCISION AND DRAINAGE OF WOUND Left 03/12/2017   Procedure: IRRIGATION AND DEBRIDEMENT LEFT HUMEROUS WOUND;  Surgeon: Roby Lofts, MD;  Location: [facility] OR;  Service: Orthopedics;  Laterality: Left;       Family History  Problem Relation Age of Onset  . Healthy Mother   . Hypertension Father     Social History   Tobacco Use  . Smoking status: Former Smoker    Quit date: 03/12/2015    Years since quitting: 5.1  . Smokeless tobacco: Never Used  Vaping Use  . Vaping Use: Never used  Substance Use Topics  . Alcohol use: No  . Drug use: No    Home Medications Prior to Admission medications  Medication Sig Start Date End Date Taking? Authorizing Provider  omeprazole (PRILOSEC) 20 MG capsule Take 1 capsule (20 mg total) by mouth daily. 04/26/20  Yes Zadie Rhine, MD    Allergies    Pork-derived products  Review of Systems   Review of Systems  Constitutional: Negative for diaphoresis and fever.  Respiratory: Positive for cough and shortness of breath.   Cardiovascular: Positive for chest pain.  Gastrointestinal: Negative for abdominal pain and vomiting.  Musculoskeletal:       Chronic back pain   All other systems reviewed  and are negative.   Physical Exam Updated Vital Signs BP 115/86   Pulse 72   Temp 98.6 F (37 C) (Oral)   Resp 16   Ht 1.727 m (5\' 8" )   Wt 90 kg   SpO2 99%   BMI 30.17 kg/m   Physical Exam CONSTITUTIONAL: Well developed/well nourished HEAD: Normocephalic/atraumatic EYES: EOMI/PERRL ENMT: Mucous membranes moist NECK: supple no meningeal signs SPINE/BACK:entire spine nontender CV: S1/S2 noted, no murmurs/rubs/gallops noted LUNGS: Lungs are clear to auscultation bilaterally, no apparent distress ABDOMEN: soft, nontender, no rebound or guarding, bowel sounds noted throughout abdomen GU:no cva tenderness NEURO: Pt is awake/alert/appropriate, moves all extremitiesx4.  No facial droop.   EXTREMITIES: pulses normal/equal, full ROM, chronic deformity of the left arm. No lower extremity edema Distal pulses equal and intact SKIN: warm, color normal PSYCH: no abnormalities of mood noted, alert and oriented to situation  ED Results / Procedures / Treatments   Labs (all labs ordered are listed, but only abnormal results are displayed) Labs Reviewed  CBC - Abnormal; Notable for the following components:      Result Value   RBC 6.70 (*)    Hemoglobin 18.3 (*)    MCV 77.5 (*)    All other components within normal limits  BASIC METABOLIC PANEL  TROPONIN I (HIGH SENSITIVITY)  TROPONIN I (HIGH SENSITIVITY)    EKG EKG Interpretation  Date/Time:  Tuesday April 25 2020 15:21:30 EDT Ventricular Rate:  90 PR Interval:  160 QRS Duration: 88 QT Interval:  344 QTC Calculation: 420 R Axis:   82 Text Interpretation: Normal sinus rhythm Right atrial enlargement Borderline ECG No significant change since last tracing Confirmed by [id number] (Zadie Rhine) on 04/26/2020 12:18:44 AM   Radiology DG Chest 2 View  Result Date: 04/25/2020 CLINICAL DATA:  Chest pain. EXAM: CHEST - 2 VIEW COMPARISON:  April 29, 2017. FINDINGS: The heart size and mediastinal contours are within normal limits.  Both lungs are clear. No pneumothorax or pleural effusion is noted. The visualized skeletal structures are unremarkable. IMPRESSION: No active cardiopulmonary disease. Electronically Signed   By: May 01, 2017 M.D.   On: 04/25/2020 16:18    Procedures Procedures   Medications Ordered in ED Medications - No data to display  ED Course  I have reviewed the triage vital signs and the nursing notes.  Pertinent labs & imaging results that were available during my care of the patient were reviewed by me and considered in my medical decision making (see chart for details).    MDM Rules/Calculators/A&P HEAR Score: 0                        Patient presents with chest pain intermittently for a week.  He is now chest pain-free.  He appears PERC negative.  Low suspicion for ACS/PE/dissection this time. We will start him on Prilosec in case this is GI related.  He  is given referrals to PCP.  Final Clinical Impression(s) / ED Diagnoses Final diagnoses:  Precordial pain    Rx / DC Orders ED Discharge Orders         Ordered    omeprazole (PRILOSEC) 20 MG capsule  Daily        04/26/20 0034           Zadie Rhine, MD 04/26/20 0131

## 2020-10-26 ENCOUNTER — Ambulatory Visit (HOSPITAL_COMMUNITY)
Admission: EM | Admit: 2020-10-26 | Discharge: 2020-10-26 | Disposition: A | Discharge status: Home / Self Care | Payer: BLUE CROSS/BLUE SHIELD | Attending: Emergency Medicine | Admitting: Emergency Medicine

## 2020-10-26 ENCOUNTER — Other Ambulatory Visit: Payer: Self-pay

## 2020-10-26 ENCOUNTER — Encounter (HOSPITAL_COMMUNITY): Payer: Self-pay

## 2020-10-26 DIAGNOSIS — R300 Dysuria: Secondary | ICD-10-CM | POA: Diagnosis not present

## 2020-10-26 LAB — POCT URINALYSIS DIPSTICK, ED / UC
Bilirubin Urine: NEGATIVE
Glucose, UA: NEGATIVE mg/dL
Hgb urine dipstick: NEGATIVE
Ketones, ur: NEGATIVE mg/dL
Leukocytes,Ua: NEGATIVE
Nitrite: NEGATIVE
Protein, ur: NEGATIVE mg/dL
Specific Gravity, Urine: 1.025 (ref 1.005–1.030)
Urobilinogen, UA: 0.2 mg/dL (ref 0.0–1.0)
pH: 6 (ref 5.0–8.0)

## 2020-10-26 LAB — POC URINE PREG, ED: Preg Test, Ur: NEGATIVE

## 2020-10-26 NOTE — ED Triage Notes (Signed)
Pt presents w/ dysuria x1d

## 2020-10-26 NOTE — ED Provider Notes (Signed)
[facility]    CSN: [id number] Arrival date & time: 10/26/20  1117      History   Chief Complaint Chief Complaint  Patient presents with   Dysuria    HPI Jason Maxwell is a 35 y.o. male.   Patient here for evaluation of dysuria and urgency that has been ongoing for the past several days.  Has not taken any OTC medication or treatment.  Denies any hematuria or penile discharge.  Denies any trauma, injury, or other precipitating event.  Denies any specific alleviating or aggravating factors.  Denies any fevers, chest pain, shortness of breath, N/V/D, numbness, tingling, weakness, abdominal pain, or headaches.    The history is provided by the patient.  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    Past Surgical History:  Procedure Laterality Date   arm surgery Left    FRACTURE SURGERY     HERNIA REPAIR Right    inguinal    HUMERUS IM NAIL Left 03/12/2017   Procedure: INTRAMEDULLARY (IM) NAIL LEFT HUMERAL;  Surgeon: Roby Lofts, MD;  Location: [facility] OR;  Service: Orthopedics;  Laterality: Left;   IM NAILING HUMERUS Left 03/12/2017   INCISION AND DRAINAGE OF WOUND Left 03/12/2017   Procedure: IRRIGATION AND DEBRIDEMENT LEFT HUMEROUS WOUND;  Surgeon: Roby Lofts, MD;  Location: [facility] OR;  Service: Orthopedics;  Laterality: Left;       Home Medications    Prior to Admission medications   Medication Sig Start Date End Date Taking? Authorizing Provider   omeprazole (PRILOSEC) 20 MG capsule Take 1 capsule (20 mg total) by mouth daily. Patient not taking: Reported on 10/26/2020 04/26/20   Zadie Rhine, MD    Family History Family History  Problem Relation Age of Onset   Healthy Mother    Hypertension Father     Social History Social History   Tobacco Use   Smoking status: Former    Types: Cigarettes    Quit date: 03/12/2015    Years since quitting: 5.6   Smokeless tobacco: Never  Vaping Use   Vaping Use: Never used  Substance Use Topics   Alcohol use: No   Drug use: No     Allergies   Pork-derived products   Review of Systems Review of Systems  Genitourinary:  Positive for dysuria and urgency. Negative for difficulty urinating, frequency, penile discharge, penile pain and penile swelling.  All other systems reviewed and are negative.   Physical Exam Triage Vital Signs ED Triage Vitals  Enc Vitals Group     BP 10/26/20 1305 (!) 142/91     Pulse Rate 10/26/20 1305 72     Resp 10/26/20 1305 14     Temp 10/26/20 1305 98.4 F (36.9 C)     Temp Source 10/26/20 1305 Oral     SpO2 10/26/20 1305 96 %     Weight --      Height --      Head Circumference --  Peak Flow --      Pain Score 10/26/20 1306 0     Pain Loc --      Pain Edu? --      Excl. in GC? --    No data found.  Updated Vital Signs BP (!) 142/91 (BP Location: Right Arm)   Pulse 72   Temp 98.4 F (36.9 C) (Oral)   Resp 14   SpO2 96%   Visual Acuity Right Eye Distance:   Left Eye Distance:   Bilateral Distance:    Right Eye Near:   Left Eye Near:    Bilateral Near:     Physical Exam Vitals and nursing note reviewed.  Constitutional:      General: He is not in acute distress.    Appearance: Normal appearance. He is not ill-appearing, toxic-appearing or diaphoretic.  HENT:     Head: Normocephalic and atraumatic.  Eyes:     Conjunctiva/sclera: Conjunctivae normal.  Cardiovascular:     Rate and Rhythm: Normal rate.     Pulses:  Normal pulses.  Pulmonary:     Effort: Pulmonary effort is normal.  Abdominal:     General: Abdomen is flat. There is no distension.     Palpations: Abdomen is soft.     Tenderness: There is abdominal tenderness in the suprapubic area. There is no right CVA tenderness or left CVA tenderness. Negative signs include Murphy's sign, Rovsing's sign, McBurney's sign, psoas sign and obturator sign.     Hernia: No hernia is present.  Musculoskeletal:        General: Normal range of motion.     Cervical back: Normal range of motion.  Skin:    General: Skin is warm and dry.  Neurological:     General: No focal deficit present.     Mental Status: He is alert and oriented to person, place, and time.  Psychiatric:        Mood and Affect: Mood normal.     UC Treatments / Results  Labs (all labs ordered are listed, but only abnormal results are displayed) Labs Reviewed  POC URINE PREG, ED  POCT URINALYSIS DIPSTICK, ED / UC  CYTOLOGY, (ORAL, ANAL, URETHRAL) ANCILLARY ONLY    EKG   Radiology No results found.  Procedures Procedures (including critical care time)  Medications Ordered in UC Medications - No data to display  Initial Impression / Assessment and Plan / UC Course  I have reviewed the triage vital signs and the nursing notes.  Pertinent labs & imaging results that were available during my care of the patient were reviewed by me and considered in my medical decision making (see chart for details).    Assessment negative for red flags or concerns.  Urinalysis within normal limits with no signs of infection.  Self swab obtained and will treat based on results.  Encourage fluids and rest.  May try AZO, cranberry pills, or Pyridium for symptom management.  Strict ED follow-up for any worsening symptoms.  Follow-up with primary care for reevaluation Final Clinical Impressions(s) / UC Diagnoses   Final diagnoses:  Dysuria     Discharge Instructions      We will contact  you if the results from your lab work are positive and require additional treatment.    You can take Tylenol and/or Ibuprofen as needed for pain relief and fever reduction.   Make sure you are drinking plenty of fluids, especially water.  You can drink cranberry juice to help with symptom  relief, but make sure it is cranberry juice and not cranberry cocktail.  You can also try AZO, cranberry pills, or pyridium as needed.    Return or go to the Emergency Department if symptoms worsen or do not improve in the next few days.       ED Prescriptions   None    PDMP not reviewed this encounter.   Ivette Loyal, NP 10/26/20 [phone]

## 2020-10-26 NOTE — Discharge Instructions (Signed)
We will contact you if the results from your lab work are positive and require additional treatment.    You can take Tylenol and/or Ibuprofen as needed for pain relief and fever reduction.   Make sure you are drinking plenty of fluids, especially water.  You can drink cranberry juice to help with symptom relief, but make sure it is cranberry juice and not cranberry cocktail.  You can also try AZO, cranberry pills, or pyridium as needed.    Return or go to the Emergency Department if symptoms worsen or do not improve in the next few days.  

## 2020-10-27 LAB — CYTOLOGY, (ORAL, ANAL, URETHRAL) ANCILLARY ONLY
Chlamydia: NEGATIVE
Comment: NEGATIVE
Comment: NEGATIVE
Comment: NORMAL
Neisseria Gonorrhea: NEGATIVE
Trichomonas: NEGATIVE

## 2021-05-09 ENCOUNTER — Ambulatory Visit (INDEPENDENT_AMBULATORY_CARE_PROVIDER_SITE_OTHER): Payer: 59 | Admitting: Orthopaedic Surgery

## 2021-05-09 ENCOUNTER — Encounter: Payer: Self-pay | Admitting: Orthopaedic Surgery

## 2021-05-09 ENCOUNTER — Ambulatory Visit (INDEPENDENT_AMBULATORY_CARE_PROVIDER_SITE_OTHER): Payer: 59

## 2021-05-09 DIAGNOSIS — Z9889 Other specified postprocedural states: Secondary | ICD-10-CM

## 2021-05-09 DIAGNOSIS — Z8781 Personal history of (healed) traumatic fracture: Secondary | ICD-10-CM

## 2021-05-09 NOTE — Progress Notes (Signed)
The patient is a pleasant 36 year old gentleman who I am seeing for the first time.  In 2019 he had quite complex surgery to his left upper extremity.  His original trauma to the left arm occurred in [place] he is from.  Apparently had multiple surgeries on that arm and eventually had hardware removed.  He has essentially no function of that arm.  He then developed significant osteomyelitis and one of our colleagues in town Dr. Jena Gauss took him to the operating room in 2019.  There was a large block of bone removed from the mid humerus and an intramedullary nail was placed as well as an antibiotic cement spacer.  He is not able to follow-up in that office anymore.  He comes in today for checkup.  He denies any pain or drainage or any other acute changes in medical status.  He works as a [employer] fast streamer. ? ?Examination of the left upper extremity shows well-healed scars from the shoulder all the way down to the hand.  He basically has no function of that arm.  It is well-perfused.  There is no evidence of infection.  There is no erythema or swelling or open wounds. ? ?2 views of the left humerus are obtained and I compared these to previous films.  There is a retained intramedullary rod and an antibiotic spacer.  I see no evidence of osteomyelitis or lytic changes in the bone. ? ?We can continue to follow him closely.  He would like to see [place] back in about 6 months and at that visit we will repeat 2 views of the left humerus.  If things worsen at all in any way he knows to let us know. ?

## 2021-11-08 ENCOUNTER — Ambulatory Visit: Payer: 59 | Admitting: Orthopaedic Surgery

## 2022-06-17 ENCOUNTER — Ambulatory Visit (INDEPENDENT_AMBULATORY_CARE_PROVIDER_SITE_OTHER): Payer: No Typology Code available for payment source | Admitting: Family Medicine

## 2022-06-17 ENCOUNTER — Encounter: Payer: Self-pay | Admitting: Family Medicine

## 2022-06-17 VITALS — BP 110/84 | HR 71 | Temp 98.4°F | Ht 68.0 in | Wt 195.0 lb

## 2022-06-17 DIAGNOSIS — Z0000 Encounter for general adult medical examination without abnormal findings: Secondary | ICD-10-CM

## 2022-06-17 DIAGNOSIS — Z23 Encounter for immunization: Secondary | ICD-10-CM

## 2022-06-17 DIAGNOSIS — Z0001 Encounter for general adult medical examination with abnormal findings: Secondary | ICD-10-CM | POA: Diagnosis not present

## 2022-06-17 DIAGNOSIS — N529 Male erectile dysfunction, unspecified: Secondary | ICD-10-CM | POA: Diagnosis not present

## 2022-06-17 DIAGNOSIS — K5900 Constipation, unspecified: Secondary | ICD-10-CM | POA: Diagnosis not present

## 2022-06-17 MED ORDER — POLYETHYLENE GLYCOL 3350 17 GM/SCOOP PO POWD: 17.0000 g | Freq: Two times a day (BID) | ORAL | 1 refills | Status: AC | PRN

## 2022-06-17 MED ORDER — SILDENAFIL CITRATE 25 MG PO TABS: 25.0000 mg | ORAL_TABLET | Freq: Every day | ORAL | 0 refills | Status: AC | PRN

## 2022-06-17 NOTE — Progress Notes (Signed)
New Patient Office Visit  Subjective    Patient ID: Jason Maxwell, male    DOB: Jan 24, 1986  Age: 37 y.o. MRN: [id number]  CC:  Chief Complaint  Patient presents with   Establish Care    HPI Jason Maxwell presents to establish care. Oriented to practice routines and expectations. PMH includes none. Concerns include constipation and erectile dysfunction.  Tobacco: non-smoker Vaccines:  Tdap today   Erectile Dysfunction: Onset of dysfunction was over 1 year ago and onset was unknown. Patient states he has difficulty maintaining erection. Full erections occur with intercourse. Partial erections occur unknown. Libido is not affected. Risk factors for ED include none idenitified. Patient denies history of any risk factors. Patient's expectations of sexual function to return to normal when he visits his wife. Patient describes relationship with partner as good. Previous treatment of ED includes none. Denies difficulty urinating, polyuria, dysuria.  Constipation Patient complains of constipation. Onset was several months ago. Patient has been having occasional formed and loose stools per week. Defecation has been difficult. Co-Morbid conditions:none. Symptoms have  n/a . Current Health Habits: Eating fiber? no, Exercise? unknown, Adequate hydration? yes . Current over the counter/prescription laxative:  Colace  which has been somewhat effective. Denies blood in stool, nausea, vomiting, abdominal pain.       Outpatient Encounter Medications as of 06/17/2022  Medication Sig   polyethylene glycol powder (GLYCOLAX/MIRALAX) 17 GM/SCOOP powder Take 17 g by mouth 2 (two) times daily as needed.   sildenafil (VIAGRA) 25 MG tablet Take 1 tablet (25 mg total) by mouth daily as needed for erectile dysfunction.   omeprazole (PRILOSEC) 20 MG capsule Take 1 capsule (20 mg total) by mouth daily. (Patient not taking: Reported on 10/26/2020)   No facility-administered encounter medications on file as  of 06/17/2022.    Past Medical History:  Diagnosis Date   Neutropenia (HCC) 05/29/2017    Past Surgical History:  Procedure Laterality Date   arm surgery Left    FRACTURE SURGERY     HERNIA REPAIR Right    inguinal    HUMERUS IM NAIL Left 03/12/2017   Procedure: INTRAMEDULLARY (IM) NAIL LEFT HUMERAL;  Surgeon: Roby Lofts, MD;  Location: [facility];  Service: Orthopedics;  Laterality: Left;   IM NAILING HUMERUS Left 03/12/2017   INCISION AND DRAINAGE OF WOUND Left 03/12/2017   Procedure: IRRIGATION AND DEBRIDEMENT LEFT HUMEROUS WOUND;  Surgeon: Roby Lofts, MD;  Location: [facility];  Service: Orthopedics;  Laterality: Left;    Family History  Problem Relation Age of Onset   Healthy Mother    Hypertension Father     Social History   Socioeconomic History   Marital status: Married    Spouse name: Not on file   Number of children: Not on file   Years of education: Not on file   Highest education level: Not on file  Occupational History   Not on file  Tobacco Use   Smoking status: Former    Types: Cigarettes    Quit date: 03/12/2015    Years since quitting: 7.2   Smokeless tobacco: Never  Vaping Use   Vaping Use: Never used  Substance and Sexual Activity   Alcohol use: No   Drug use: No   Sexual activity: Not Currently    Birth control/protection: Condom  Other Topics Concern   Not on file  Social History Narrative   Not on file   Social Determinants of Health   Financial Resource Strain: Not on  file  Food Insecurity: Not on file  Transportation Needs: Not on file  Physical Activity: Not on file  Stress: Not on file  Social Connections: Not on file  Intimate Partner Violence: Not on file    Review of Systems  All other systems reviewed and are negative.       Objective    BP 110/84   Pulse 71   Temp 98.4 F (36.9 C) (Oral)   Ht 5\' 8"  (1.727 m)   Wt 195 lb (88.5 kg)   SpO2 99%   BMI 29.65 kg/m   Physical Exam Vitals and nursing note reviewed.   Constitutional:      Appearance: Normal appearance. He is normal weight.  HENT:     Head: Normocephalic and atraumatic.     Right Ear: Tympanic membrane, ear canal and external ear normal.     Left Ear: Tympanic membrane, ear canal and external ear normal.     Nose: Nose normal.     Mouth/Throat:     Mouth: Mucous membranes are moist.     Pharynx: Oropharynx is clear.  Eyes:     Extraocular Movements: Extraocular movements intact.     Right eye: Normal extraocular motion and no nystagmus.     Left eye: Normal extraocular motion and no nystagmus.     Conjunctiva/sclera: Conjunctivae normal.     Pupils: Pupils are equal, round, and reactive to light.  Cardiovascular:     Rate and Rhythm: Normal rate and regular rhythm.     Pulses: Normal pulses.     Heart sounds: Normal heart sounds.  Pulmonary:     Effort: Pulmonary effort is normal.     Breath sounds: Normal breath sounds.  Abdominal:     General: Bowel sounds are normal. There is no distension.     Palpations: Abdomen is soft.     Tenderness: There is no abdominal tenderness.  Genitourinary:    Comments: Deferred using shared decision making Musculoskeletal:        General: Normal range of motion.     Cervical back: Normal range of motion and neck supple.  Skin:    General: Skin is warm and dry.     Capillary Refill: Capillary refill takes less than 2 seconds.  Neurological:     General: No focal deficit present.     Mental Status: He is alert and oriented to person, place, and time. Mental status is at baseline.  Psychiatric:        Mood and Affect: Mood normal.        Speech: Speech normal.        Behavior: Behavior normal.        Thought Content: Thought content normal.        Cognition and Memory: Cognition and memory normal.        Judgment: Judgment normal.         Assessment & Plan:   Problem List Items Addressed This Visit     Physical exam, annual - Primary    Today your medical history was reviewed  and routine physical exam with labs was performed. Recommend 150 minutes of moderate intensity exercise weekly and consuming a well-balanced diet. Advised to stop smoking if a smoker, avoid smoking if a non-smoker, limit alcohol consumption to 1 drink per day for women and 2 drinks per day for men, and avoid illicit drug use. Counseled on safe sex practices and offered STI testing today. Counseled on the importance of sunscreen use.  Counseled in mental health awareness and when to seek medical care. Vaccine maintenance discussed. Appropriate health maintenance items reviewed. Return to office in 1 year for annual physical exam.       Relevant Orders   CBC with Differential/Platelet   COMPLETE METABOLIC PANEL WITH GFR   Lipid panel   TSH   Hemoglobin A1c   Erectile dysfunction    Problem sustaining erection. Will obtain A1c, TSH, CBC, and CMP today. Denies structural abnormalities, exam declined, no urinary problems, he does report constipation. He reports some anxiety surrounding this. He travels to [place] to visit his wife 3-4 times yearly and would like to try medication. Start Sildenafil 25-50mg  daily PRN 1 hour prior to sexual intercourse.      Relevant Orders   TSH   Hemoglobin A1c   Constipation    Chronic. Has tried Colace and this helps him have 2-3 bowel movements weekly, sometimes liquid, sometimes formed. No red flags. Encouraged high fiber diet, exercise, and push fluids. Start Polyethylene Glycol BID PRN.       Return in about 1 year (around 06/17/2023) for annual physical.   Park Meo, FNP

## 2022-06-17 NOTE — Assessment & Plan Note (Signed)

## 2022-06-17 NOTE — Addendum Note (Signed)
Addended by: Arta Silence on: 06/17/2022 12:02 PM   Modules accepted: Orders

## 2022-06-17 NOTE — Assessment & Plan Note (Signed)
Chronic. Has tried Colace and this helps him have 2-3 bowel movements weekly, sometimes liquid, sometimes formed. No red flags. Encouraged high fiber diet, exercise, and push fluids. Start Polyethylene Glycol BID PRN.

## 2022-06-17 NOTE — Patient Instructions (Addendum)
It was great to meet you today and I'm excited to have you join the [facility] practice. I hope you had a positive experience today! If you feel so inclined, please feel free to recommend our practice to friends and family. Kurtis Bushman, FNP-C  For the constipation: Drink at least 8 cups of water daily Exercise daily Increase Fiber intake Miralax as needed Return to office if symptoms are not improved in 1 month

## 2022-06-17 NOTE — Assessment & Plan Note (Signed)
Problem sustaining erection. Will obtain A1c, TSH, CBC, and CMP today. Denies structural abnormalities, exam declined, no urinary problems, he does report constipation. He reports some anxiety surrounding this. He travels to [place] to visit his wife 3-4 times yearly and would like to try medication. Start Sildenafil 25-50mg  daily PRN 1 hour prior to sexual intercourse.

## 2022-06-18 LAB — LIPID PANEL
Cholesterol: 217 mg/dL — ABNORMAL HIGH (ref <200)
HDL: 62 mg/dL (ref >=40)
LDL Cholesterol (Calc): 130 mg/dL (calc) — ABNORMAL HIGH
Non-HDL Cholesterol (Calc): 155 mg/dL (calc) — ABNORMAL HIGH (ref <130)
Total CHOL/HDL Ratio: 3.5 (calc) (ref <5.0)
Triglycerides: 130 mg/dL (ref <150)

## 2022-06-18 LAB — CBC WITH DIFFERENTIAL/PLATELET
Absolute Monocytes: 588 cells/uL (ref 200–950)
Basophils Absolute: 40 cells/uL (ref 0–200)
Basophils Relative: 1 %
Eosinophils Absolute: 100 cells/uL (ref 15–500)
Eosinophils Relative: 2.5 %
HCT: 47.8 % (ref 38.5–50.0)
Hemoglobin: 16.3 g/dL (ref 13.2–17.1)
Lymphs Abs: 1984 cells/uL (ref 850–3900)
MCH: 26.4 pg — ABNORMAL LOW (ref 27.0–33.0)
MCHC: 34.1 g/dL (ref 32.0–36.0)
MCV: 77.5 fL — ABNORMAL LOW (ref 80.0–100.0)
MPV: 10.5 fL (ref 7.5–12.5)
Monocytes Relative: 14.7 %
Neutro Abs: 1288 cells/uL — ABNORMAL LOW (ref 1500–7800)
Neutrophils Relative %: 32.2 %
Platelets: 244 10*3/uL (ref 140–400)
RBC: 6.17 10*6/uL — ABNORMAL HIGH (ref 4.20–5.80)
RDW: 13.9 % (ref 11.0–15.0)
Total Lymphocyte: 49.6 %
WBC: 4 10*3/uL (ref 3.8–10.8)

## 2022-06-18 LAB — HEMOGLOBIN A1C
Hgb A1c MFr Bld: 5.8 % of total Hgb — ABNORMAL HIGH (ref <5.7)
Mean Plasma Glucose: 120 mg/dL
eAG (mmol/L): 6.6 mmol/L

## 2022-06-18 LAB — TSH: TSH: 2.28 mIU/L (ref 0.40–4.50)

## 2022-06-18 LAB — COMPLETE METABOLIC PANEL WITH GFR
AG Ratio: 1.7 (calc) (ref 1.0–2.5)
ALT: 21 U/L (ref 9–46)
AST: 18 U/L (ref 10–40)
Albumin: 4.6 g/dL (ref 3.6–5.1)
Alkaline phosphatase (APISO): 55 U/L (ref 36–130)
BUN: 16 mg/dL (ref 7–25)
CO2: 25 mmol/L (ref 20–32)
Calcium: 10.1 mg/dL (ref 8.6–10.3)
Chloride: 104 mmol/L (ref 98–110)
Creat: 1.1 mg/dL (ref 0.60–1.26)
Globulin: 2.7 g/dL (calc) (ref 1.9–3.7)
Glucose, Bld: 97 mg/dL (ref 65–99)
Potassium: 4.2 mmol/L (ref 3.5–5.3)
Sodium: 139 mmol/L (ref 135–146)
Total Bilirubin: 0.8 mg/dL (ref 0.2–1.2)
Total Protein: 7.3 g/dL (ref 6.1–8.1)
eGFR: 89 mL/min/{1.73_m2} (ref >=60)

## 2023-04-14 ENCOUNTER — Other Ambulatory Visit: Payer: Self-pay | Admitting: Family Medicine

## 2023-04-14 DIAGNOSIS — Z3141 Encounter for fertility testing: Secondary | ICD-10-CM

## 2023-05-08 ENCOUNTER — Telehealth: Payer: Self-pay | Admitting: Family Medicine

## 2023-05-08 NOTE — Telephone Encounter (Signed)
 Patient following up on referral he received to Urology. Insurance on file is [facility]; urologist he was referred to doesn't accept his insurance. Patient needs to be referred to a different office.  Please advise at [phone].

## 2023-06-19 ENCOUNTER — Encounter: Payer: No Typology Code available for payment source | Admitting: Family Medicine

## 2023-11-13 ENCOUNTER — Ambulatory Visit (INDEPENDENT_AMBULATORY_CARE_PROVIDER_SITE_OTHER): Admitting: Family Medicine

## 2023-11-13 DIAGNOSIS — Z23 Encounter for immunization: Secondary | ICD-10-CM

## 2023-11-13 NOTE — Progress Notes (Signed)
 Patient is in office today for a nurse visit for Immunization. Patient Injection was given in the  Right deltoid. Patient tolerated injection well.
